# Patient Record
Sex: Female | Born: 2000 | Race: White | Hispanic: No | Marital: Single | Smoking: Current some day smoker
Health system: Southern US, Community
[De-identification: ages and names within clinical notes are randomized; demographics above are authoritative.]

## PROBLEM LIST (undated history)

## (undated) DIAGNOSIS — F32A Depression, unspecified: Secondary | ICD-10-CM

## (undated) DIAGNOSIS — A749 Chlamydial infection, unspecified: Secondary | ICD-10-CM

## (undated) HISTORY — PX: NO PAST SURGERIES: SHX2092

---

## 2019-09-05 NOTE — L&D Delivery Note (Addendum)
Carolyn Macias is a 19 y.o.@ s/p vaginal delivery at [redacted]w[redacted]d.  She was admitted for post dates IOL from MAU where she initially presented for vaginal bleeding.    ROM: 10h 1m with clear white stained fluid GBS Status: negative Maximum Maternal Temperature: 99.8 F   Labor Progress: Pt in latent labor on admission.  She continued to progress well AROM performed for light meconium stained fluid. Cytotec and pitocin was also initiated and pt then was noted to have complete cervical dilation. She then delivered as noted below.   Delivery Date/Time: 08/04/2020 at 1508 Delivery: Called to room and patient was complete and pushing. Head delivered LOA. Single nuchal cord present. Shoulder dystocia identified. McRoberts, followed by attempt at delivery of posterior arm. Successfully delivered newborn with Woodscrew maneuver. Total Dystocia time 70 seconds.   Cord clamped x 2 without delay and cut by physician. Cord blood drawn. Placenta delivered spontaneously with gentle cord traction. Fundus firm with massage and Pitocin. Labia, perineum, vagina, and cervix were inspected; 1st degree perineal and left labial lacerations visualized with repair performed.    Placenta: intact, 3-vessel cord, cord gasses obtained, sent to L&D Complications: none Lacerations: 1st degree perineal and left labial with repair  EBL: 75 ml Analgesia: epidural, local lidocaine    Infant: female  APGARs 4 & 9  weight per medical record  Anupa Ganta, DO  I was present and gloved for delivery and performed above shoulder dystocia maneuvers. I agree with the findings and the plan of care as documented in the resident's note.  Casper Harrison, MD [facility] Family Medicine Fellow, [facility] for [facility], [facility]

## 2020-04-24 ENCOUNTER — Inpatient Hospital Stay (HOSPITAL_COMMUNITY)
Admission: AD | Admit: 2020-04-24 | Discharge: 2020-04-25 | Disposition: A | Discharge status: Home / Self Care | Payer: Medicaid Other | Attending: Obstetrics and Gynecology | Admitting: Obstetrics and Gynecology

## 2020-04-24 ENCOUNTER — Other Ambulatory Visit: Payer: Self-pay

## 2020-04-24 DIAGNOSIS — O2342 Unspecified infection of urinary tract in pregnancy, second trimester: Secondary | ICD-10-CM | POA: Diagnosis not present

## 2020-04-24 DIAGNOSIS — R109 Unspecified abdominal pain: Secondary | ICD-10-CM | POA: Diagnosis present

## 2020-04-24 DIAGNOSIS — M7918 Myalgia, other site: Secondary | ICD-10-CM | POA: Insufficient documentation

## 2020-04-24 DIAGNOSIS — R1032 Left lower quadrant pain: Secondary | ICD-10-CM | POA: Diagnosis not present

## 2020-04-24 DIAGNOSIS — O26892 Other specified pregnancy related conditions, second trimester: Secondary | ICD-10-CM | POA: Diagnosis not present

## 2020-04-24 DIAGNOSIS — Z3A26 26 weeks gestation of pregnancy: Secondary | ICD-10-CM

## 2020-04-24 DIAGNOSIS — O99352 Diseases of the nervous system complicating pregnancy, second trimester: Secondary | ICD-10-CM | POA: Insufficient documentation

## 2020-04-24 DIAGNOSIS — O26899 Other specified pregnancy related conditions, unspecified trimester: Secondary | ICD-10-CM

## 2020-04-24 LAB — URINALYSIS, ROUTINE W REFLEX MICROSCOPIC
Bacteria, UA: NONE SEEN
Bilirubin Urine: NEGATIVE
Glucose, UA: NEGATIVE mg/dL
Hgb urine dipstick: NEGATIVE
Ketones, ur: NEGATIVE mg/dL
Nitrite: NEGATIVE
Protein, ur: NEGATIVE mg/dL
Specific Gravity, Urine: 1.024 (ref 1.005–1.030)
pH: 6 (ref 5.0–8.0)

## 2020-04-24 LAB — POCT PREGNANCY, URINE: Preg Test, Ur: POSITIVE — AB

## 2020-04-25 ENCOUNTER — Inpatient Hospital Stay (HOSPITAL_BASED_OUTPATIENT_CLINIC_OR_DEPARTMENT_OTHER): Payer: Medicaid Other

## 2020-04-25 ENCOUNTER — Encounter (HOSPITAL_COMMUNITY): Payer: Self-pay

## 2020-04-25 ENCOUNTER — Other Ambulatory Visit: Payer: Self-pay

## 2020-04-25 DIAGNOSIS — O2342 Unspecified infection of urinary tract in pregnancy, second trimester: Secondary | ICD-10-CM

## 2020-04-25 DIAGNOSIS — M7918 Myalgia, other site: Secondary | ICD-10-CM

## 2020-04-25 DIAGNOSIS — O26892 Other specified pregnancy related conditions, second trimester: Secondary | ICD-10-CM

## 2020-04-25 DIAGNOSIS — Z3686 Encounter for antenatal screening for cervical length: Secondary | ICD-10-CM

## 2020-04-25 DIAGNOSIS — Z3A26 26 weeks gestation of pregnancy: Secondary | ICD-10-CM

## 2020-04-25 DIAGNOSIS — R109 Unspecified abdominal pain: Secondary | ICD-10-CM

## 2020-04-25 LAB — COMPREHENSIVE METABOLIC PANEL
ALT: 16 U/L (ref 0–44)
AST: 20 U/L (ref 15–41)
Albumin: 3.4 g/dL — ABNORMAL LOW (ref 3.5–5.0)
Alkaline Phosphatase: 63 U/L (ref 38–126)
Anion gap: 9 (ref 5–15)
BUN: 10 mg/dL (ref 6–20)
CO2: 24 mmol/L (ref 22–32)
Calcium: 9.3 mg/dL (ref 8.9–10.3)
Chloride: 101 mmol/L (ref 98–111)
Creatinine, Ser: 0.61 mg/dL (ref 0.44–1.00)
GFR calc Af Amer: >60 mL/min (ref >60)
GFR calc non Af Amer: >60 mL/min (ref >60)
Glucose, Bld: 78 mg/dL (ref 70–99)
Potassium: 4 mmol/L (ref 3.5–5.1)
Sodium: 134 mmol/L — ABNORMAL LOW (ref 135–145)
Total Bilirubin: 0.4 mg/dL (ref 0.3–1.2)
Total Protein: 6.8 g/dL (ref 6.5–8.1)

## 2020-04-25 LAB — HIV ANTIBODY (ROUTINE TESTING W REFLEX): HIV Screen 4th Generation wRfx: NONREACTIVE

## 2020-04-25 LAB — CBC WITH DIFFERENTIAL/PLATELET
Abs Immature Granulocytes: 0.63 10*3/uL — ABNORMAL HIGH (ref 0.00–0.07)
Basophils Absolute: 0.2 10*3/uL — ABNORMAL HIGH (ref 0.0–0.1)
Basophils Relative: 1 %
Eosinophils Absolute: 0.3 10*3/uL (ref 0.0–0.5)
Eosinophils Relative: 2 %
HCT: 35.3 % — ABNORMAL LOW (ref 36.0–46.0)
Hemoglobin: 11.7 g/dL — ABNORMAL LOW (ref 12.0–15.0)
Immature Granulocytes: 4 %
Lymphocytes Relative: 23 %
Lymphs Abs: 3.4 10*3/uL (ref 0.7–4.0)
MCH: 32.4 pg (ref 26.0–34.0)
MCHC: 33.1 g/dL (ref 30.0–36.0)
MCV: 97.8 fL (ref 80.0–100.0)
Monocytes Absolute: 1.4 10*3/uL — ABNORMAL HIGH (ref 0.1–1.0)
Monocytes Relative: 9 %
Neutro Abs: 8.7 10*3/uL — ABNORMAL HIGH (ref 1.7–7.7)
Neutrophils Relative %: 61 %
Platelets: 263 10*3/uL (ref 150–400)
RBC: 3.61 MIL/uL — ABNORMAL LOW (ref 3.87–5.11)
RDW: 12.1 % (ref 11.5–15.5)
WBC: 14.5 10*3/uL — ABNORMAL HIGH (ref 4.0–10.5)
nRBC: 0 % (ref 0.0–0.2)

## 2020-04-25 LAB — ABO/RH: ABO/RH(D): O POS

## 2020-04-25 LAB — WET PREP, GENITAL
Clue Cells Wet Prep HPF POC: NONE SEEN
Sperm: NONE SEEN
Trich, Wet Prep: NONE SEEN
Yeast Wet Prep HPF POC: NONE SEEN

## 2020-04-25 MED ORDER — IBUPROFEN 600 MG PO TABS
600.0000 mg | ORAL_TABLET | Freq: Once | ORAL | Status: AC
  Administered 2020-04-25: 600 mg via ORAL
  Filled 2020-04-25: qty 1

## 2020-04-25 MED ORDER — CEPHALEXIN 500 MG PO CAPS
500.0000 mg | ORAL_CAPSULE | Freq: Four times a day (QID) | ORAL | 0 refills | Status: DC
Start: 2020-04-25 — End: 2020-07-05

## 2020-04-25 MED ORDER — CYCLOBENZAPRINE HCL 5 MG PO TABS
10.0000 mg | ORAL_TABLET | Freq: Once | ORAL | Status: AC
  Administered 2020-04-25: 10 mg via ORAL
  Filled 2020-04-25: qty 2

## 2020-04-25 MED ORDER — CYCLOBENZAPRINE HCL 10 MG PO TABS: 10.0000 mg | ORAL_TABLET | Freq: Three times a day (TID) | ORAL | 0 refills | Status: DC | PRN

## 2020-04-25 NOTE — Discharge Instructions (Signed)
[facility] Prenatal Care Providers °         °[facility] locations:  °Hours may vary. Please call for an appointment ° °[facility] @ [place] ° [address]  °[phone] ° °[facility] @ [facility]  ° [address]  °[phone] ° °[facility] @ [place]      ° [address] °[phone]   °         °[facility] @ [place]    ° [address] °[phone] °         °[facility] @ [place]  ° [address] °[phone] ° °[facility] @ [facility] ° [address] °[phone] °    °[facility] @ [facility]) ° [address]  ° [phone] ° °

## 2020-04-25 NOTE — MAU Provider Note (Signed)
History     CSN: [id number]  Arrival date and time: 04/24/20 2154   First Provider Initiated Contact with Patient 04/25/20 0057      Chief Complaint  Patient presents with  . Abdominal Pain   HPI  Ms.Carolyn Macias is a 19 y.o. female G1P0 @ [redacted]w[redacted]d here with LLQ pain that radiates around to her left hip. The pain woke her up from her sleep last night. She tried tylenol which did not help. She moved from [place] 5 days ago and has been carrying and moving things. No bleeding.  The pain worsens when she walks.  The pain is better when she is sitting up. She was receiving regular prenatal care in [place]. Reports pain/pressure after urinating.   OB History    Gravida  1   Para      Term      Preterm      AB      Living        SAB      TAB      Ectopic      Multiple      Live Births              History reviewed. No pertinent past medical history.  Past Surgical History:  Procedure Laterality Date  . NO PAST SURGERIES      No family history on file.  Social History   Tobacco Use  . Smoking status: Never Smoker  . Smokeless tobacco: Never Used  Substance Use Topics  . Alcohol use: Never  . Drug use: Never    Allergies: No Known Allergies  Medications Prior to Admission  Medication Sig Dispense Refill Last Dose  . acetaminophen (TYLENOL) 500 MG tablet Take 500 mg by mouth every 6 (six) hours as needed.   04/25/2020 at 9pm  . Prenatal Vit-Fe Fumarate-FA (MULTIVITAMIN-PRENATAL) 27-0.8 MG TABS tablet Take 1 tablet by mouth daily at 12 noon.   04/25/2020 at Unknown time   Results for orders placed or performed during the hospital encounter of 04/24/20 (from the past 48 hour(s))  Pregnancy, urine POC     Status: Abnormal   Collection Time: 04/24/20 10:32 PM  Result Value Ref Range   Preg Test, Ur POSITIVE (A) NEGATIVE    Comment:        THE SENSITIVITY OF THIS METHODOLOGY IS >24 mIU/mL   Urinalysis, Routine w reflex microscopic Urine,  Clean Catch     Status: Abnormal   Collection Time: 04/24/20 11:20 PM  Result Value Ref Range   Color, Urine YELLOW YELLOW   APPearance CLOUDY (A) CLEAR   Specific Gravity, Urine 1.024 1.005 - 1.030   pH 6.0 5.0 - 8.0   Glucose, UA NEGATIVE NEGATIVE mg/dL   Hgb urine dipstick NEGATIVE NEGATIVE   Bilirubin Urine NEGATIVE NEGATIVE   Ketones, ur NEGATIVE NEGATIVE mg/dL   Protein, ur NEGATIVE NEGATIVE mg/dL   Nitrite NEGATIVE NEGATIVE   Leukocytes,Ua SMALL (A) NEGATIVE   RBC / HPF 0-5 0 - 5 RBC/hpf   WBC, UA 6-10 0 - 5 WBC/hpf   Bacteria, UA NONE SEEN NONE SEEN   Squamous Epithelial / LPF 0-5 0 - 5   Mucus PRESENT    Amorphous Crystal PRESENT     Comment: Performed at [facility], [address], [place], [place] [address]  CBC with Differential/Platelet     Status: Abnormal   Collection Time: 04/25/20 12:19 AM  Result Value Ref Range   WBC 14.5 (  H) 4.0 - 10.5 K/uL   RBC 3.61 (L) 3.87 - 5.11 MIL/uL   Hemoglobin 11.7 (L) 12.0 - 15.0 g/dL   HCT 79.8 (L) 36 - 46 %   MCV 97.8 80.0 - 100.0 fL   MCH 32.4 26.0 - 34.0 pg   MCHC 33.1 30.0 - 36.0 g/dL   RDW 92.1 19.4 - 17.4 %   Platelets 263 150 - 400 K/uL   nRBC 0.0 0.0 - 0.2 %   Neutrophils Relative % 61 %   Neutro Abs 8.7 (H) 1.7 - 7.7 K/uL   Lymphocytes Relative 23 %   Lymphs Abs 3.4 0.7 - 4.0 K/uL   Monocytes Relative 9 %   Monocytes Absolute 1.4 (H) 0 - 1 K/uL   Eosinophils Relative 2 %   Eosinophils Absolute 0.3 0 - 0 K/uL   Basophils Relative 1 %   Basophils Absolute 0.2 (H) 0 - 0 K/uL   Immature Granulocytes 4 %   Abs Immature Granulocytes 0.63 (H) 0.00 - 0.07 K/uL    Comment: Performed at [facility], [address], [place], [place] [address]  ABO/Rh     Status: None   Collection Time: 04/25/20 12:19 AM  Result Value Ref Range   ABO/RH(D) O POS    No rh immune globuloin      NOT A RH IMMUNE GLOBULIN CANDIDATE, PT RH POSITIVE Performed at [facility], [address], [place], [place] [address]        Review of Systems  Constitutional: Negative for fever.  Genitourinary: Positive for urgency. Negative for dysuria and flank pain.  Psychiatric/Behavioral: Hallucinations:    Physical Exam   Blood pressure 116/70, pulse 73, temperature 97.8 F (36.6 C), temperature source Oral, resp. rate 16, last menstrual period 10/22/2019, SpO2 100 %.  Physical Exam Constitutional:      General: She is not in acute distress.    Appearance: Normal appearance. She is not ill-appearing.  HENT:     Head: Normocephalic.  Eyes:     Pupils: Pupils are equal, round, and reactive to light.  Abdominal:     Palpations: Abdomen is soft.     Tenderness: There is abdominal tenderness in the left lower quadrant. There is no right CVA tenderness, left CVA tenderness, guarding or rebound.  Genitourinary:    Comments: Cervix: Closed, thick, posterior.  Musculoskeletal:       Arms:     Lumbar back: Tenderness present. Normal range of motion.  Skin:    General: Skin is warm.  Neurological:     General: No focal deficit present.     Mental Status: She is alert.  Psychiatric:        Mood and Affect: Mood normal.    Fetal Tracing: Baseline: 130 bpm Variability: Moderate  Accelerations:  15x15 Decelerations: None Toco: UI  MAU Course  Procedures  None   MDM  Symptoms likely secondary to musculoskeletal and urinary.  Patient given flexeril and ibuprofen with improvement in pain.  Urine culture pending   Assessment and Plan   A:  1. Musculoskeletal pain   2. Abdominal pain affecting pregnancy   3. [redacted] weeks gestation of pregnancy   4. Urinary tract infection in mother during second trimester of pregnancy     P:  Discharge home in stable condition List of providers given- encouraged to call to initiate care Rx: Keflex, flexeril No lifting over 5 lbs Rest Return to MAU if symptoms worsen   Toby Breithaupt, Victorino Dike  I, NP 04/26/2020 3:59 PM

## 2020-04-25 NOTE — MAU Note (Signed)
Carolyn Macias is a 19 y.o. at Unknown here in MAU reporting: left sided pain and tightness since lastnight pt also state she feel nauseous.   Onset of complaint: lastnight Pain score: 6 Vitals:   04/25/20 0003  BP: 116/70  Pulse: 73  Resp: 16  Temp: 97.8 F (36.6 C)  SpO2: 100%

## 2020-04-26 LAB — CULTURE, OB URINE: Culture: <10000 — AB

## 2020-04-26 LAB — GC/CHLAMYDIA PROBE AMP (~~LOC~~) NOT AT ARMC
Chlamydia: NEGATIVE
Comment: NEGATIVE
Comment: NORMAL
Neisseria Gonorrhea: NEGATIVE

## 2020-07-05 ENCOUNTER — Inpatient Hospital Stay (HOSPITAL_COMMUNITY)
Admission: AD | Admit: 2020-07-05 | Discharge: 2020-07-05 | Disposition: A | Discharge status: Home / Self Care | Payer: Medicaid Other | Attending: Family Medicine | Admitting: Family Medicine

## 2020-07-05 ENCOUNTER — Encounter (HOSPITAL_COMMUNITY): Payer: Self-pay | Admitting: Family Medicine

## 2020-07-05 DIAGNOSIS — R55 Syncope and collapse: Secondary | ICD-10-CM | POA: Insufficient documentation

## 2020-07-05 DIAGNOSIS — O99891 Other specified diseases and conditions complicating pregnancy: Secondary | ICD-10-CM | POA: Diagnosis not present

## 2020-07-05 DIAGNOSIS — Z3A36 36 weeks gestation of pregnancy: Secondary | ICD-10-CM | POA: Diagnosis not present

## 2020-07-05 DIAGNOSIS — R42 Dizziness and giddiness: Secondary | ICD-10-CM | POA: Diagnosis not present

## 2020-07-05 DIAGNOSIS — Z87891 Personal history of nicotine dependence: Secondary | ICD-10-CM | POA: Diagnosis not present

## 2020-07-05 DIAGNOSIS — O26893 Other specified pregnancy related conditions, third trimester: Secondary | ICD-10-CM | POA: Diagnosis present

## 2020-07-05 HISTORY — DX: Chlamydial infection, unspecified: A74.9

## 2020-07-05 LAB — CBC WITH DIFFERENTIAL/PLATELET
Abs Immature Granulocytes: 0.72 10*3/uL — ABNORMAL HIGH (ref 0.00–0.07)
Basophils Absolute: 0.1 10*3/uL (ref 0.0–0.1)
Basophils Relative: 1 %
Eosinophils Absolute: 0.2 10*3/uL (ref 0.0–0.5)
Eosinophils Relative: 1 %
HCT: 32.2 % — ABNORMAL LOW (ref 36.0–46.0)
Hemoglobin: 10.5 g/dL — ABNORMAL LOW (ref 12.0–15.0)
Immature Granulocytes: 5 %
Lymphocytes Relative: 13 %
Lymphs Abs: 1.9 10*3/uL (ref 0.7–4.0)
MCH: 30.9 pg (ref 26.0–34.0)
MCHC: 32.6 g/dL (ref 30.0–36.0)
MCV: 94.7 fL (ref 80.0–100.0)
Monocytes Absolute: 1.1 10*3/uL — ABNORMAL HIGH (ref 0.1–1.0)
Monocytes Relative: 8 %
Neutro Abs: 10.3 10*3/uL — ABNORMAL HIGH (ref 1.7–7.7)
Neutrophils Relative %: 72 %
Platelets: 230 10*3/uL (ref 150–400)
RBC: 3.4 MIL/uL — ABNORMAL LOW (ref 3.87–5.11)
RDW: 13.4 % (ref 11.5–15.5)
WBC: 14.4 10*3/uL — ABNORMAL HIGH (ref 4.0–10.5)
nRBC: 0 % (ref 0.0–0.2)

## 2020-07-05 LAB — TYPE AND SCREEN
ABO/RH(D): O POS
Antibody Screen: NEGATIVE

## 2020-07-05 LAB — URINALYSIS, ROUTINE W REFLEX MICROSCOPIC
Bilirubin Urine: NEGATIVE
Glucose, UA: NEGATIVE mg/dL
Hgb urine dipstick: NEGATIVE
Ketones, ur: NEGATIVE mg/dL
Leukocytes,Ua: NEGATIVE
Nitrite: NEGATIVE
Protein, ur: NEGATIVE mg/dL
Specific Gravity, Urine: 1.015 (ref 1.005–1.030)
pH: 7 (ref 5.0–8.0)

## 2020-07-05 LAB — COMPREHENSIVE METABOLIC PANEL
ALT: 11 U/L (ref 0–44)
AST: 16 U/L (ref 15–41)
Albumin: 2.7 g/dL — ABNORMAL LOW (ref 3.5–5.0)
Alkaline Phosphatase: 87 U/L (ref 38–126)
Anion gap: 8 (ref 5–15)
BUN: 5 mg/dL — ABNORMAL LOW (ref 6–20)
CO2: 24 mmol/L (ref 22–32)
Calcium: 9.2 mg/dL (ref 8.9–10.3)
Chloride: 103 mmol/L (ref 98–111)
Creatinine, Ser: 0.61 mg/dL (ref 0.44–1.00)
GFR, Estimated: >60 mL/min (ref >60)
Glucose, Bld: 91 mg/dL (ref 70–99)
Potassium: 3.8 mmol/L (ref 3.5–5.1)
Sodium: 135 mmol/L (ref 135–145)
Total Bilirubin: 0.6 mg/dL (ref 0.3–1.2)
Total Protein: 6.2 g/dL — ABNORMAL LOW (ref 6.5–8.1)

## 2020-07-05 LAB — MAGNESIUM: Magnesium: 1.6 mg/dL — ABNORMAL LOW (ref 1.7–2.4)

## 2020-07-05 LAB — HIV ANTIBODY (ROUTINE TESTING W REFLEX): HIV Screen 4th Generation wRfx: NONREACTIVE

## 2020-07-05 LAB — HEPATITIS B SURFACE ANTIGEN: Hepatitis B Surface Ag: NONREACTIVE

## 2020-07-05 NOTE — Discharge Instructions (Signed)

## 2020-07-05 NOTE — MAU Provider Note (Signed)
History     CSN: [id number]  Arrival date and time: 07/05/20 1426   First Provider Initiated Contact with Patient 07/05/20 1507      Chief Complaint  Patient presents with  . Near Syncope   HPI This is a 19 yo G1 at [redacted]w[redacted]d by stated EDC. Patient had care in [place] until about [redacted] weeks gestation. She and her partner moved to [place] around that time and haven't had care since that time. Presents with dizziness earlier today and wanted to get checked out. Dizziness improved. Adequate fluid intake.   OB History    Gravida  1   Para      Term      Preterm      AB      Living        SAB      TAB      Ectopic      Multiple      Live Births              Past Medical History:  Diagnosis Date  . Chlamydia     Past Surgical History:  Procedure Laterality Date  . NO PAST SURGERIES      No family history on file.  Social History   Tobacco Use  . Smoking status: Former Games developer  . Smokeless tobacco: Never Used  Substance Use Topics  . Alcohol use: Never  . Drug use: Never    Allergies: No Known Allergies  Medications Prior to Admission  Medication Sig Dispense Refill Last Dose  . calcium carbonate (TUMS - DOSED IN MG ELEMENTAL CALCIUM) 500 MG chewable tablet Chew 1 tablet by mouth daily.     . Prenatal Vit-Fe Fumarate-FA (MULTIVITAMIN-PRENATAL) 27-0.8 MG TABS tablet Take 1 tablet by mouth daily at 12 noon.   07/05/2020 at Unknown time  . acetaminophen (TYLENOL) 500 MG tablet Take 500 mg by mouth every 6 (six) hours as needed.     . cephALEXin (KEFLEX) 500 MG capsule Take 1 capsule (500 mg total) by mouth 4 (four) times daily. 28 capsule 0   . cyclobenzaprine (FLEXERIL) 10 MG tablet Take 1 tablet (10 mg total) by mouth 3 (three) times daily as needed for muscle spasms. 20 tablet 0 More than a month at Unknown time    Review of Systems Physical Exam   Blood pressure 122/69, pulse 99, temperature 98.7 F (37.1 C), resp. rate 18, last menstrual period  10/22/2019.  Physical Exam Vitals reviewed.  Constitutional:      Appearance: Normal appearance.  Cardiovascular:     Rate and Rhythm: Normal rate and regular rhythm.     Pulses: Normal pulses.     Heart sounds: Normal heart sounds.  Pulmonary:     Effort: Pulmonary effort is normal.     Breath sounds: Normal breath sounds.  Abdominal:     General: Abdomen is flat.     Palpations: Abdomen is soft.  Skin:    General: Skin is warm.     Capillary Refill: Capillary refill takes less than 2 seconds.  Neurological:     Mental Status: She is alert.  Psychiatric:        Mood and Affect: Mood normal.        Behavior: Behavior normal.        Thought Content: Thought content normal.        Judgment: Judgment normal.    Results for orders placed or performed during the hospital encounter of 07/05/20 (from the  past 24 hour(s))  Urinalysis, Routine w reflex microscopic Urine, Clean Catch     Status: Abnormal   Collection Time: 07/05/20  3:08 PM  Result Value Ref Range   Color, Urine YELLOW YELLOW   APPearance CLOUDY (A) CLEAR   Specific Gravity, Urine 1.015 1.005 - 1.030   pH 7.0 5.0 - 8.0   Glucose, UA NEGATIVE NEGATIVE mg/dL   Hgb urine dipstick NEGATIVE NEGATIVE   Bilirubin Urine NEGATIVE NEGATIVE   Ketones, ur NEGATIVE NEGATIVE mg/dL   Protein, ur NEGATIVE NEGATIVE mg/dL   Nitrite NEGATIVE NEGATIVE   Leukocytes,Ua NEGATIVE NEGATIVE  Comprehensive metabolic panel     Status: Abnormal   Collection Time: 07/05/20  3:21 PM  Result Value Ref Range   Sodium 135 135 - 145 mmol/L   Potassium 3.8 3.5 - 5.1 mmol/L   Chloride 103 98 - 111 mmol/L   CO2 24 22 - 32 mmol/L   Glucose, Bld 91 70 - 99 mg/dL   BUN 5 (L) 6 - 20 mg/dL   Creatinine, Ser 7.74 0.44 - 1.00 mg/dL   Calcium 9.2 8.9 - 12.8 mg/dL   Total Protein 6.2 (L) 6.5 - 8.1 g/dL   Albumin 2.7 (L) 3.5 - 5.0 g/dL   AST 16 15 - 41 U/L   ALT 11 0 - 44 U/L   Alkaline Phosphatase 87 38 - 126 U/L   Total Bilirubin 0.6 0.3 - 1.2  mg/dL   GFR, Estimated >78 >67 mL/min   Anion gap 8 5 - 15  Magnesium     Status: Abnormal   Collection Time: 07/05/20  3:21 PM  Result Value Ref Range   Magnesium 1.6 (L) 1.7 - 2.4 mg/dL  Type and screen [facility]     Status: None (Preliminary result)   Collection Time: 07/05/20  3:21 PM  Result Value Ref Range   ABO/RH(D) PENDING    Antibody Screen PENDING    Sample Expiration      07/08/2020,2359 Performed at [facility], [address], [place], [place] [address]   CBC with Differential/Platelet     Status: Abnormal   Collection Time: 07/05/20  3:21 PM  Result Value Ref Range   WBC 14.4 (H) 4.0 - 10.5 K/uL   RBC 3.40 (L) 3.87 - 5.11 MIL/uL   Hemoglobin 10.5 (L) 12.0 - 15.0 g/dL   HCT 47.0 (L) 36 - 46 %   MCV 94.7 80.0 - 100.0 fL   MCH 30.9 26.0 - 34.0 pg   MCHC 32.6 30.0 - 36.0 g/dL   RDW 96.2 83.6 - 62.9 %   Platelets 230 150 - 400 K/uL   nRBC 0.0 0.0 - 0.2 %   Neutrophils Relative % 72 %   Neutro Abs 10.3 (H) 1.7 - 7.7 K/uL   Lymphocytes Relative 13 %   Lymphs Abs 1.9 0.7 - 4.0 K/uL   Monocytes Relative 8 %   Monocytes Absolute 1.1 (H) 0.1 - 1.0 K/uL   Eosinophils Relative 1 %   Eosinophils Absolute 0.2 0.0 - 0.5 K/uL   Basophils Relative 1 %   Basophils Absolute 0.1 0.0 - 0.1 K/uL   Immature Granulocytes 5 %   Abs Immature Granulocytes 0.72 (H) 0.00 - 0.07 K/uL     MAU Course  Procedures EKG reviewed by me: Normal sinus rhythm. No acute ST changes. Inverted p wave in V1 - possible L atrial enlargement.  NST 140s, moderate variability. +accels. No decels or contractions.  MDM  Assessment and Plan  1. [redacted] weeks gestation of pregnancy  2. Postural dizziness with presyncope  PNL obtained.  CMP and CBC reassuring Cat 1 tracing Establish care in office    Levie Heritage 07/05/2020, 3:20 PM

## 2020-07-05 NOTE — MAU Note (Signed)
Pt moved here from [place] a few weeks ago. Needs to establish prenatal care.  Called and was told [facility] would be able to take her. Concerned because it had been a while since she has had a prenatal visit. Reports  She was feeling dizzy overhthere weekend while they where moving in. (did not due much lifting or moving of stuff). Reports regular fetal movement felt. Denies any pain or cramping no leaking or bleeding.

## 2020-07-06 ENCOUNTER — Encounter: Payer: Self-pay | Admitting: Family Medicine

## 2020-07-06 DIAGNOSIS — O99891 Other specified diseases and conditions complicating pregnancy: Secondary | ICD-10-CM | POA: Insufficient documentation

## 2020-07-06 DIAGNOSIS — O09899 Supervision of other high risk pregnancies, unspecified trimester: Secondary | ICD-10-CM | POA: Insufficient documentation

## 2020-07-06 LAB — RUBELLA SCREEN: Rubella: <0.9 index — ABNORMAL LOW (ref >0.99)

## 2020-07-06 LAB — RPR: RPR Ser Ql: NONREACTIVE

## 2020-07-13 ENCOUNTER — Telehealth: Payer: Self-pay | Admitting: Family Medicine

## 2020-07-13 NOTE — Telephone Encounter (Signed)
Spoke with patient about her visit in the MAU, and how the provider wanted her to be set up for an appointment in our office. She stated she was 38 weeks, and was no need to get an appointment scheduled. I then informed her that because she has [employer] Medicaid, she needed to call her worker to get it switched over to one that is not out-of-network. I informed her she would get a large bill from the hospital. She said she would call them.

## 2020-07-28 ENCOUNTER — Inpatient Hospital Stay (HOSPITAL_COMMUNITY): Admit: 2020-07-28 | Payer: Self-pay

## 2020-07-31 ENCOUNTER — Other Ambulatory Visit: Payer: Self-pay | Admitting: Obstetrics & Gynecology

## 2020-07-31 ENCOUNTER — Inpatient Hospital Stay (HOSPITAL_BASED_OUTPATIENT_CLINIC_OR_DEPARTMENT_OTHER): Payer: Medicaid Other

## 2020-07-31 ENCOUNTER — Inpatient Hospital Stay (HOSPITAL_COMMUNITY)
Admission: AD | Admit: 2020-07-31 | Discharge: 2020-07-31 | Disposition: A | Discharge status: Home / Self Care | Payer: Medicaid Other | Attending: Obstetrics & Gynecology | Admitting: Obstetrics & Gynecology

## 2020-07-31 ENCOUNTER — Other Ambulatory Visit: Payer: Self-pay

## 2020-07-31 ENCOUNTER — Encounter (HOSPITAL_COMMUNITY): Payer: Self-pay | Admitting: Obstetrics & Gynecology

## 2020-07-31 ENCOUNTER — Other Ambulatory Visit: Payer: Self-pay | Admitting: Advanced Practice Midwife

## 2020-07-31 DIAGNOSIS — Z3A40 40 weeks gestation of pregnancy: Secondary | ICD-10-CM

## 2020-07-31 DIAGNOSIS — O26893 Other specified pregnancy related conditions, third trimester: Secondary | ICD-10-CM

## 2020-07-31 DIAGNOSIS — Z87891 Personal history of nicotine dependence: Secondary | ICD-10-CM | POA: Diagnosis not present

## 2020-07-31 DIAGNOSIS — Z0379 Encounter for other suspected maternal and fetal conditions ruled out: Secondary | ICD-10-CM | POA: Diagnosis not present

## 2020-07-31 DIAGNOSIS — O480 Post-term pregnancy: Secondary | ICD-10-CM | POA: Diagnosis present

## 2020-07-31 DIAGNOSIS — R102 Pelvic and perineal pain: Secondary | ICD-10-CM

## 2020-07-31 DIAGNOSIS — O0933 Supervision of pregnancy with insufficient antenatal care, third trimester: Secondary | ICD-10-CM

## 2020-07-31 DIAGNOSIS — R519 Headache, unspecified: Secondary | ICD-10-CM | POA: Diagnosis not present

## 2020-07-31 DIAGNOSIS — O479 False labor, unspecified: Secondary | ICD-10-CM

## 2020-07-31 DIAGNOSIS — Z3689 Encounter for other specified antenatal screening: Secondary | ICD-10-CM

## 2020-07-31 LAB — OB RESULTS CONSOLE GBS: GBS: NEGATIVE

## 2020-07-31 NOTE — Discharge Instructions (Signed)
Return on Wednesday at 11:45 pm for induction.

## 2020-07-31 NOTE — MAU Note (Signed)
Really bad HA last night, continued until this afternoon. Thursday had some ? ctxs or Deberah Pelton.  Just wanted to be sure everything was ok, she is 3 days past her due date.  No pain, bleeding  Or leaking at this time.  Reports +FM

## 2020-07-31 NOTE — MAU Provider Note (Signed)
History     CSN: [id number]  Arrival date and time: 07/31/20 1723   First Provider Initiated Contact with Patient 07/31/20 1759      Chief Complaint  Patient presents with  . wanting to make sure everything is ok   HPI Carolyn Macias 19 y.o. [redacted]w[redacted]d Comes to MAU today to make sure that the baby is OK.  She had a severe headache yesterday and this morning but it is now resolved.  She was worried that her BP was high but it is normal in MAU. Client moved from [place] at 26 weeks and has been seen in MAU twice.  She does not see a provider in [place] for prenatal care.  Did apply for Medicaid in [place] but was assigned to [facility] and that is not an insurance accepted in [facility] offices.   OB History    Gravida  1   Para      Term      Preterm      AB      Living        SAB      TAB      Ectopic      Multiple      Live Births              Past Medical History:  Diagnosis Date  . Chlamydia     Past Surgical History:  Procedure Laterality Date  . NO PAST SURGERIES      History reviewed. No pertinent family history.  Social History   Tobacco Use  . Smoking status: Former Games developer  . Smokeless tobacco: Never Used  Substance Use Topics  . Alcohol use: Never  . Drug use: Never    Allergies: No Known Allergies  Medications Prior to Admission  Medication Sig Dispense Refill Last Dose  . acetaminophen (TYLENOL) 500 MG tablet Take 500 mg by mouth every 6 (six) hours as needed.   07/30/2020 at Unknown time  . calcium carbonate (TUMS - DOSED IN MG ELEMENTAL CALCIUM) 500 MG chewable tablet Chew 1 tablet by mouth daily.   07/30/2020 at Unknown time  . cyclobenzaprine (FLEXERIL) 10 MG tablet Take 1 tablet (10 mg total) by mouth 3 (three) times daily as needed for muscle spasms. 20 tablet 0 Past Month at Unknown time  . Prenatal Vit-Fe Fumarate-FA (MULTIVITAMIN-PRENATAL) 27-0.8 MG TABS tablet Take 1 tablet by mouth daily at 12 noon.   07/31/2020 at Unknown  time    Review of Systems  Constitutional: Negative for fever.  Respiratory: Negative for cough and shortness of breath.   Gastrointestinal: Negative for abdominal pain, nausea and vomiting.  Genitourinary: Negative for dysuria, vaginal bleeding and vaginal discharge.   Physical Exam   Blood pressure 134/72, pulse (!) 117, temperature 98.2 F (36.8 C), temperature source Oral, resp. rate 16, height 5\' 5"  (1.651 m), weight 90.5 kg, last menstrual period 10/22/2019, SpO2 100 %.  Physical Exam Vitals and nursing note reviewed.  Constitutional:      Appearance: She is well-developed.  HENT:     Head: Normocephalic.  Abdominal:     Palpations: Abdomen is soft.     Tenderness: There is no abdominal tenderness. There is no guarding or rebound.  Genitourinary:    Comments: GBS and GC/Chlam done by vaginal swabs Cervical exam - Vertex at -2 with very posterior cervex that is closed but soft. Musculoskeletal:        General: Normal range of motion.     Cervical  back: Neck supple.  Skin:    General: Skin is warm and dry.  Neurological:     Mental Status: She is alert and oriented to person, place, and time.     MAU Course  Procedures  MDM Consult with Dr. Macon Large - will schedule for midnight induction on Wednesday night. BPP today is 8/8 with good amount of amniotic fluid, BP is in normal range. No headache. NST - FHT baseline is 140 with accels of >15X15 noted, No decelerations, Occasional contraction - Category 1 tracing.  Assessment and Plan  Post term pregnancy, stable Reactive NST  Plan GBS pending Midnight induction scheduled - to come in on Wednesday, 08-04-20 at 11:45 pm for induction. Return sooner if water breaks, baby is not moving well or having regular labor contractions.  Carolyn Bernheim, RN, MSN, NP-BC Nurse Practitioner, [facility] for [employer], [facility] 07/31/2020 8:48 PM    Carolyn Macias Carolyn Macias 07/31/2020, 6:08 PM

## 2020-08-02 ENCOUNTER — Telehealth (HOSPITAL_COMMUNITY): Payer: Self-pay | Admitting: *Deleted

## 2020-08-02 LAB — GC/CHLAMYDIA PROBE AMP (~~LOC~~) NOT AT ARMC
Chlamydia: NEGATIVE
Comment: NEGATIVE
Comment: NORMAL
Neisseria Gonorrhea: NEGATIVE

## 2020-08-02 LAB — CULTURE, BETA STREP (GROUP B ONLY)

## 2020-08-02 NOTE — Telephone Encounter (Signed)
Preadmission screen  

## 2020-08-03 ENCOUNTER — Inpatient Hospital Stay (HOSPITAL_COMMUNITY): Payer: Medicaid Other | Admitting: Anesthesiology

## 2020-08-03 ENCOUNTER — Encounter (HOSPITAL_COMMUNITY): Payer: Self-pay | Admitting: Obstetrics & Gynecology

## 2020-08-03 ENCOUNTER — Other Ambulatory Visit (HOSPITAL_COMMUNITY): Payer: Medicaid Other | Attending: Family Medicine

## 2020-08-03 ENCOUNTER — Inpatient Hospital Stay (HOSPITAL_COMMUNITY)
Admission: AD | Admit: 2020-08-03 | Discharge: 2020-08-06 | DRG: 807 | Disposition: A | Discharge status: Home / Self Care | Payer: Medicaid Other | Attending: Obstetrics and Gynecology | Admitting: Obstetrics and Gynecology

## 2020-08-03 ENCOUNTER — Other Ambulatory Visit: Payer: Self-pay

## 2020-08-03 ENCOUNTER — Telehealth (HOSPITAL_COMMUNITY): Payer: Self-pay | Admitting: *Deleted

## 2020-08-03 DIAGNOSIS — O480 Post-term pregnancy: Secondary | ICD-10-CM | POA: Diagnosis present

## 2020-08-03 DIAGNOSIS — Z20822 Contact with and (suspected) exposure to covid-19: Secondary | ICD-10-CM | POA: Diagnosis present

## 2020-08-03 DIAGNOSIS — O99214 Obesity complicating childbirth: Secondary | ICD-10-CM | POA: Diagnosis present

## 2020-08-03 DIAGNOSIS — E669 Obesity, unspecified: Secondary | ICD-10-CM | POA: Diagnosis present

## 2020-08-03 DIAGNOSIS — Z3A41 41 weeks gestation of pregnancy: Secondary | ICD-10-CM | POA: Diagnosis not present

## 2020-08-03 DIAGNOSIS — Z3A40 40 weeks gestation of pregnancy: Secondary | ICD-10-CM | POA: Diagnosis not present

## 2020-08-03 DIAGNOSIS — O0933 Supervision of pregnancy with insufficient antenatal care, third trimester: Secondary | ICD-10-CM

## 2020-08-03 DIAGNOSIS — Z23 Encounter for immunization: Secondary | ICD-10-CM

## 2020-08-03 DIAGNOSIS — Z8759 Personal history of other complications of pregnancy, childbirth and the puerperium: Secondary | ICD-10-CM | POA: Diagnosis not present

## 2020-08-03 DIAGNOSIS — Z87891 Personal history of nicotine dependence: Secondary | ICD-10-CM | POA: Diagnosis not present

## 2020-08-03 LAB — COMPREHENSIVE METABOLIC PANEL
ALT: 15 U/L (ref 0–44)
AST: 19 U/L (ref 15–41)
Albumin: 3 g/dL — ABNORMAL LOW (ref 3.5–5.0)
Alkaline Phosphatase: 111 U/L (ref 38–126)
Anion gap: 11 (ref 5–15)
BUN: 9 mg/dL (ref 6–20)
CO2: 20 mmol/L — ABNORMAL LOW (ref 22–32)
Calcium: 9.2 mg/dL (ref 8.9–10.3)
Chloride: 104 mmol/L (ref 98–111)
Creatinine, Ser: 0.66 mg/dL (ref 0.44–1.00)
GFR, Estimated: >60 mL/min (ref >60)
Glucose, Bld: 67 mg/dL — ABNORMAL LOW (ref 70–99)
Potassium: 3.6 mmol/L (ref 3.5–5.1)
Sodium: 135 mmol/L (ref 135–145)
Total Bilirubin: 0.6 mg/dL (ref 0.3–1.2)
Total Protein: 6.7 g/dL (ref 6.5–8.1)

## 2020-08-03 LAB — CBC
HCT: 36.1 % (ref 36.0–46.0)
Hemoglobin: 11.5 g/dL — ABNORMAL LOW (ref 12.0–15.0)
MCH: 29.7 pg (ref 26.0–34.0)
MCHC: 31.9 g/dL (ref 30.0–36.0)
MCV: 93.3 fL (ref 80.0–100.0)
Platelets: 228 10*3/uL (ref 150–400)
RBC: 3.87 MIL/uL (ref 3.87–5.11)
RDW: 13.8 % (ref 11.5–15.5)
WBC: 13.5 10*3/uL — ABNORMAL HIGH (ref 4.0–10.5)
nRBC: 0 % (ref 0.0–0.2)

## 2020-08-03 LAB — RESP PANEL BY RT-PCR (FLU A&B, COVID) ARPGX2
Influenza A by PCR: NEGATIVE
Influenza B by PCR: NEGATIVE
SARS Coronavirus 2 by RT PCR: NEGATIVE

## 2020-08-03 LAB — TYPE AND SCREEN
ABO/RH(D): O POS
Antibody Screen: NEGATIVE

## 2020-08-03 MED ORDER — PHENYLEPHRINE 40 MCG/ML (10ML) SYRINGE FOR IV PUSH (FOR BLOOD PRESSURE SUPPORT): 80.0000 ug | PREFILLED_SYRINGE | INTRAVENOUS | Status: DC | PRN

## 2020-08-03 MED ORDER — FENTANYL-BUPIVACAINE-NACL 0.5-0.125-0.9 MG/250ML-% EP SOLN
EPIDURAL | Status: AC
  Filled 2020-08-03: qty 250

## 2020-08-03 MED ORDER — EPHEDRINE 5 MG/ML INJ: 10.0000 mg | INTRAVENOUS | Status: DC | PRN

## 2020-08-03 MED ORDER — FAMOTIDINE IN NACL 20-0.9 MG/50ML-% IV SOLN
20.0000 mg | Freq: Once | INTRAVENOUS | Status: AC
  Administered 2020-08-03: 20 mg via INTRAVENOUS
  Filled 2020-08-03: qty 50

## 2020-08-03 MED ORDER — FENTANYL-BUPIVACAINE-NACL 0.5-0.125-0.9 MG/250ML-% EP SOLN: 12.0000 mL/h | EPIDURAL | Status: DC | PRN

## 2020-08-03 MED ORDER — ONDANSETRON HCL 4 MG/2ML IJ SOLN: 4.0000 mg | Freq: Four times a day (QID) | INTRAMUSCULAR | Status: DC | PRN

## 2020-08-03 MED ORDER — SODIUM CHLORIDE (PF) 0.9 % IJ SOLN
INTRAMUSCULAR | Status: DC | PRN
  Administered 2020-08-03: 12 mL/h via EPIDURAL

## 2020-08-03 MED ORDER — TERBUTALINE SULFATE 1 MG/ML IJ SOLN: 0.2500 mg | Freq: Once | INTRAMUSCULAR | Status: DC | PRN

## 2020-08-03 MED ORDER — MISOPROSTOL 25 MCG QUARTER TABLET: 25.0000 ug | ORAL_TABLET | ORAL | Status: DC | PRN

## 2020-08-03 MED ORDER — ZOLPIDEM TARTRATE 5 MG PO TABS: 5.0000 mg | ORAL_TABLET | Freq: Every evening | ORAL | Status: DC | PRN

## 2020-08-03 MED ORDER — SOD CITRATE-CITRIC ACID 500-334 MG/5ML PO SOLN: 30.0000 mL | ORAL | Status: DC | PRN

## 2020-08-03 MED ORDER — OXYCODONE-ACETAMINOPHEN 5-325 MG PO TABS: 1.0000 | ORAL_TABLET | ORAL | Status: DC | PRN

## 2020-08-03 MED ORDER — OXYTOCIN-SODIUM CHLORIDE 30-0.9 UT/500ML-% IV SOLN
2.5000 [IU]/h | INTRAVENOUS | Status: DC
  Filled 2020-08-03: qty 500

## 2020-08-03 MED ORDER — DIPHENHYDRAMINE HCL 50 MG/ML IJ SOLN: 12.5000 mg | INTRAMUSCULAR | Status: DC | PRN

## 2020-08-03 MED ORDER — MISOPROSTOL 50MCG HALF TABLET
50.0000 ug | ORAL_TABLET | ORAL | Status: DC | PRN
  Administered 2020-08-03: 50 ug via BUCCAL
  Filled 2020-08-03: qty 1

## 2020-08-03 MED ORDER — HYDROXYZINE HCL 50 MG PO TABS: 50.0000 mg | ORAL_TABLET | Freq: Four times a day (QID) | ORAL | Status: DC | PRN

## 2020-08-03 MED ORDER — ACETAMINOPHEN 325 MG PO TABS: 650.0000 mg | ORAL_TABLET | ORAL | Status: DC | PRN

## 2020-08-03 MED ORDER — LACTATED RINGERS IV SOLN: INTRAVENOUS | Status: DC

## 2020-08-03 MED ORDER — OXYCODONE-ACETAMINOPHEN 5-325 MG PO TABS: 2.0000 | ORAL_TABLET | ORAL | Status: DC | PRN

## 2020-08-03 MED ORDER — LIDOCAINE HCL (PF) 1 % IJ SOLN
INTRAMUSCULAR | Status: DC | PRN
  Administered 2020-08-03: 10 mL via EPIDURAL
  Administered 2020-08-03: 2 mL via EPIDURAL

## 2020-08-03 MED ORDER — OXYTOCIN BOLUS FROM INFUSION
333.0000 mL | Freq: Once | INTRAVENOUS | Status: AC
  Administered 2020-08-04: 333 mL via INTRAVENOUS

## 2020-08-03 MED ORDER — FENTANYL CITRATE (PF) 100 MCG/2ML IJ SOLN
50.0000 ug | INTRAMUSCULAR | Status: DC | PRN
  Administered 2020-08-03: 100 ug via INTRAVENOUS
  Administered 2020-08-03: 50 ug via INTRAVENOUS
  Administered 2020-08-04: 100 ug via INTRAVENOUS
  Filled 2020-08-03 (×3): qty 2

## 2020-08-03 MED ORDER — CALCIUM CARBONATE ANTACID 500 MG PO CHEW: 1.0000 | CHEWABLE_TABLET | Freq: Once | ORAL | Status: DC

## 2020-08-03 MED ORDER — FLEET ENEMA 7-19 GM/118ML RE ENEM: 1.0000 | ENEMA | Freq: Every day | RECTAL | Status: DC | PRN

## 2020-08-03 MED ORDER — LACTATED RINGERS IV SOLN: 500.0000 mL | INTRAVENOUS | Status: DC | PRN

## 2020-08-03 MED ORDER — LACTATED RINGERS IV SOLN
500.0000 mL | Freq: Once | INTRAVENOUS | Status: AC
  Administered 2020-08-03: 500 mL via INTRAVENOUS

## 2020-08-03 MED ORDER — LIDOCAINE HCL (PF) 1 % IJ SOLN
30.0000 mL | INTRAMUSCULAR | Status: AC | PRN
  Administered 2020-08-04: 30 mL via SUBCUTANEOUS
  Filled 2020-08-03 (×2): qty 30

## 2020-08-03 MED ORDER — OXYTOCIN-SODIUM CHLORIDE 30-0.9 UT/500ML-% IV SOLN
1.0000 m[IU]/min | INTRAVENOUS | Status: DC
  Administered 2020-08-04: 2 m[IU]/min via INTRAVENOUS

## 2020-08-03 NOTE — Progress Notes (Signed)
Labor Progress Note Carolyn Macias is a 19 y.o. G1P0 at 71w6dpresented to MAU for vaginal bleeding after IC admitted for post dates IOL.   S:Patient is doing well. No complaints or concerns at this time. Is feeling her contractions intermittently.   O:  BP 129/84   Pulse 80   Temp 98.2 F (36.8 C) (Oral)   Resp 17   Ht _0  (1.651 m)   Wt 91 kg   LMP 10/22/2019   SpO2 100%   BMI 33.40 kg/m  EFM: 160/moderate variability/+accels, no decels  CVE: Dilation: 2 Effacement (%): 50 Station: -2 Presentation: Vertex Exam by:: Dr. FSylvester Harder  A&P: 19y.o. G1P0 467w6dresented to MAU for vaginal bleeding after IC admitted for post dates IOL.  #Labor: Progressing well. Risk/benefits of FB discussed. FB placed without difficulty. Will dose with buccal cytotec. Will recheck in 4 hours or when FB falls out.   #Varicella Non-immune: will get MMR pp  #Pain: epidural #FWB: cat 1 strip #GBS negative   CaLayla BarterMD 8:31 PM

## 2020-08-03 NOTE — Anesthesia Procedure Notes (Signed)
Epidural Patient location during procedure: OB Start time: 08/03/2020 11:00 PM End time: 08/03/2020 11:08 PM  Staffing Anesthesiologist: Lannie Fields, DO Performed: anesthesiologist   Preanesthetic Checklist Completed: patient identified, IV checked, risks and benefits discussed, monitors and equipment checked, pre-op evaluation and timeout performed  Epidural Patient position: sitting Prep: DuraPrep and site prepped and draped Patient monitoring: continuous pulse ox, blood pressure, heart rate and cardiac monitor Approach: midline Location: L3-L4 Injection technique: LOR air  Needle:  Needle type: Tuohy  Needle gauge: 17 G Needle length: 9 cm Needle insertion depth: 6 cm Catheter type: closed end flexible Catheter size: 19 Gauge Catheter at skin depth: 11 cm Test dose: negative  Assessment Sensory level: T8 Events: blood not aspirated, injection not painful, no injection resistance, no paresthesia and negative IV test  Additional Notes Patient identified. Risks/Benefits/Options discussed with patient including but not limited to bleeding, infection, nerve damage, paralysis, failed block, incomplete pain control, headache, blood pressure changes, nausea, vomiting, reactions to medication both or allergic, itching and postpartum back pain. Confirmed with bedside nurse the patient's most recent platelet count. Confirmed with patient that they are not currently taking any anticoagulation, have any bleeding history or any family history of bleeding disorders. Patient expressed understanding and wished to proceed. All questions were answered. Sterile technique was used throughout the entire procedure. Please see nursing notes for vital signs. Test dose was given through epidural catheter and negative prior to continuing to dose epidural or start infusion. Warning signs of high block given to the patient including shortness of breath, tingling/numbness in hands, complete motor  block, or any concerning symptoms with instructions to call for help. Patient was given instructions on fall risk and not to get out of bed. All questions and concerns addressed with instructions to call with any issues or inadequate analgesia.  Reason for block:procedure for pain

## 2020-08-03 NOTE — Progress Notes (Signed)
RN came to discuss pt - pt here for vaginal bleeding during IC. Pt brought a picture of the bleeding, it was heavier than typical bloody show. Pt now having painful contractions q5-40min. She is scheduled for an IOL tomorrow night. Labor team Philipp Deputy, CNM) amenable to admission for labor/augmentation as needed. Admission orders for IOL already in.  Edd Arbour, CNM, MSN, [facility] 08/03/20 5:31 PM

## 2020-08-03 NOTE — Anesthesia Preprocedure Evaluation (Signed)
Anesthesia Evaluation  Patient identified by MRN, date of birth, ID band Patient awake    Reviewed: Allergy & Precautions, NPO status , Patient's Chart, lab work & pertinent test results  Airway Mallampati: II  TM Distance: >3 FB Neck ROM: Full    Dental no notable dental hx.    Pulmonary former smoker,    Pulmonary exam normal breath sounds clear to auscultation       Cardiovascular negative cardio ROS Normal cardiovascular exam Rhythm:Regular Rate:Normal     Neuro/Psych negative neurological ROS  negative psych ROS   GI/Hepatic negative GI ROS, Neg liver ROS,   Endo/Other  Obesity BMI 33  Renal/GU negative Renal ROS  negative genitourinary   Musculoskeletal negative musculoskeletal ROS (+)   Abdominal   Peds negative pediatric ROS (+)  Hematology negative hematology ROS (+) hct 36.1, plt 228   Anesthesia Other Findings   Reproductive/Obstetrics (+) Pregnancy                             Anesthesia Physical Anesthesia Plan  ASA: II and emergent  Anesthesia Plan: Epidural   Post-op Pain Management:    Induction:   PONV Risk Score and Plan: 2  Airway Management Planned: Natural Airway  Additional Equipment: None  Intra-op Plan:   Post-operative Plan:   Informed Consent: I have reviewed the patients History and Physical, chart, labs and discussed the procedure including the risks, benefits and alternatives for the proposed anesthesia with the patient or authorized representative who has indicated his/her understanding and acceptance.       Plan Discussed with:   Anesthesia Plan Comments:         Anesthesia Quick Evaluation

## 2020-08-03 NOTE — MAU Note (Signed)
This morning was having sex, and "had some really heavy bleeding and something fell out".  Thought it might be her plug.  Bleeding now is very light.  Has had some cramps, last one only lasted a few seconds.  Scheduled for induction tomorrow. Denies placental issues

## 2020-08-03 NOTE — Telephone Encounter (Signed)
Preadmission screen  

## 2020-08-03 NOTE — H&P (Addendum)
OBSTETRIC ADMISSION HISTORY AND PHYSICAL Carolyn Macias is a 19 y.o. female G1P0 with IUP at [redacted]w[redacted]d by LMP presenting for vaginal bleeding after intercourse. She has scheduled induction tomorrow. She reports +FMs, No LOF, no blurry vision, headaches or peripheral edema, and RUQ pain.  She plans on breast feeding. She in unsure what she would like for for birth control. She received her initial prenatal care in [place] and has not established care with anyone since moving to [place] at 27 weeks due to insurance approval.    Dating: By LMP --->  Estimated Date of Delivery: 07/28/20  Sono:    @[redacted]w[redacted]d , CWD, normal anatomy, cephalic presentation, 62% EFW   Prenatal History/Complications:  -post dates pregnancy at [redacted]w[redacted]d -limited prenatal care  -GBS negative -unknown maternal diabetic status -GERD with pregnancy  Past Medical History: Past Medical History:  Diagnosis Date  . Chlamydia     Past Surgical History: Past Surgical History:  Procedure Laterality Date  . NO PAST SURGERIES      Obstetrical History: OB History    Gravida  1   Para      Term      Preterm      AB      Living        SAB      TAB      Ectopic      Multiple      Live Births              Social History Social History   Socioeconomic History  . Marital status: Single    Spouse name: Not on file  . Number of children: Not on file  . Years of education: Not on file  . Highest education level: Not on file  Occupational History  . Not on file  Tobacco Use  . Smoking status: Former [redacted]w[redacted]d  . Smokeless tobacco: Never Used  Substance and Sexual Activity  . Alcohol use: Never  . Drug use: Never  . Sexual activity: Not on file  Other Topics Concern  . Not on file  Social History Narrative  . Not on file   Social Determinants of Health   Financial Resource Strain:   . Difficulty of Paying Living Expenses: Not on file  Food Insecurity:   . Worried About Games developer in the Last Year: Not on file  . Ran Out of Food in the Last Year: Not on file  Transportation Needs:   . Lack of Transportation (Medical): Not on file  . Lack of Transportation (Non-Medical): Not on file  Physical Activity:   . Days of Exercise per Week: Not on file  . Minutes of Exercise per Session: Not on file  Stress:   . Feeling of Stress : Not on file  Social Connections:   . Frequency of Communication with Friends and Family: Not on file  . Frequency of Social Gatherings with Friends and Family: Not on file  . Attends Religious Services: Not on file  . Active Member of Clubs or Organizations: Not on file  . Attends Brewing technologist Meetings: Not on file  . Marital Status: Not on file    Family History: No family history on file.  Allergies: No Known Allergies  Medications Prior to Admission  Medication Sig Dispense Refill Last Dose  . calcium carbonate (TUMS - DOSED IN MG ELEMENTAL CALCIUM) 500 MG chewable tablet Chew 1 tablet by mouth daily.   08/03/2020 at Unknown time  .  acetaminophen (TYLENOL) 500 MG tablet Take 500 mg by mouth every 6 (six) hours as needed.     . cyclobenzaprine (FLEXERIL) 10 MG tablet Take 1 tablet (10 mg total) by mouth 3 (three) times daily as needed for muscle spasms. 20 tablet 0   . Prenatal Vit-Fe Fumarate-FA (MULTIVITAMIN-PRENATAL) 27-0.8 MG TABS tablet Take 1 tablet by mouth daily at 12 noon.        Review of Systems   All systems reviewed and negative except as stated in HPI  Blood pressure 131/64, pulse 85, temperature 98.2 F (36.8 C), temperature source Oral, resp. rate 16, height 5\' 5"  (1.651 m), weight 91 kg, last menstrual period 10/22/2019, SpO2 100 %. General appearance: alert, cooperative and no distress Chest: normal respiratory effort  Abdomen: soft, non-tender; gravid Extremities: no sign of DVT, no LE edema noted bilaterally  Presentation: cephalic Fetal monitoringBaseline: 135 bpm, Variability: Good {> 6 bpm),  Accelerations: Reactive and Decelerations: Absent Uterine activity: intermittent  Dilation: 1 Exam by:: [email or web address], RN   Prenatal labs: ABO, Rh: --/--/O POS (11/01 1521) Antibody: NEG (11/01 1521) Rubella: <0.90 (11/01 1521) RPR: NON REACTIVE (11/01 1521)  HBsAg: NON REACTIVE (11/01 1521)  HIV: Non Reactive (11/01 1521)  GBS:   negative (07/31/2020) 1 hr Glucola: not performed  Genetic screening: not performed  Anatomy 08/02/2020: normal (08/01/2020)  Prenatal Transfer Tool  Maternal Diabetes: unknown Genetic Screening: not performed Maternal Ultrasounds/Referrals: Normal Fetal Ultrasounds or other Referrals:  Referred to Materal Fetal Medicine  Maternal Substance Abuse:  No Significant Maternal Medications:  None Significant Maternal Lab Results: Group B Strep negative  No results found for this or any previous visit (from the past 24 hour(s)).  Patient Active Problem List   Diagnosis Date Noted  . Limited prenatal care in third trimester 07/31/2020  . Post term pregnancy over 40 weeks 07/31/2020  . Rubella non-immune status, antepartum 07/06/2020    Assessment/Plan:  Carolyn Macias is a 19 y.o. G1P0 at [redacted]w[redacted]d here for new onset vaginal bleeding, originally scheduled for an induction on 08/04/2020.  #Labor: Will start cytotec and insert folley balloon. Continue to monitor progression. #Pain: epidural desired #FWB: Category 1 #ID: GBS negative  #MOF: breast #MOC: Undecided, discussed multiple options with patient and needs time to decide. #Circ: N/a (girl) #Limited prenatal care: consider SW consult following delivery. #Unknown glucose status: Most recent CMP notable for glucose 91 wnl, consider obtaining CMP.  #Reflux: one-time dose of tums given   14/09/2019, DO  08/03/2020, 5:26 PM  CNM attestation:  I have seen and examined this patient; I agree with above documentation in the resident's note.   Carolyn Macias is a 19 y.o. G1P0 here for IOL due to previous vag  bldg (has since stopped) & postdates  PE: BP 104/65   Pulse 92   Temp 98.2 F (36.8 C) (Oral)   Resp 16   Ht 5\' 5"  (1.651 m)   Wt 91 kg   LMP 10/22/2019   SpO2 100%   BMI 33.40 kg/m  Gen: calm comfortable, NAD Resp: normal effort, no distress Abd: gravid Bedside u/s: confirmed vtx  ROS, labs, PMH reviewed  Plan: Admit to Labor and Delivery Plan for cx ripening with foley/cytotec to start, and then progress to Pit/AROM prn Anticipate vag del  CNM 08/03/2020, 7:12 PM

## 2020-08-04 ENCOUNTER — Encounter (HOSPITAL_COMMUNITY): Payer: Self-pay | Admitting: Obstetrics & Gynecology

## 2020-08-04 DIAGNOSIS — Z3A41 41 weeks gestation of pregnancy: Secondary | ICD-10-CM

## 2020-08-04 DIAGNOSIS — O480 Post-term pregnancy: Secondary | ICD-10-CM

## 2020-08-04 LAB — RPR: RPR Ser Ql: NONREACTIVE

## 2020-08-04 MED ORDER — WITCH HAZEL-GLYCERIN EX PADS: 1.0000 "application " | MEDICATED_PAD | CUTANEOUS | Status: DC | PRN

## 2020-08-04 MED ORDER — PRENATAL MULTIVITAMIN CH
1.0000 | ORAL_TABLET | Freq: Every day | ORAL | Status: DC
  Administered 2020-08-06: 1 via ORAL
  Filled 2020-08-04: qty 1

## 2020-08-04 MED ORDER — IBUPROFEN 600 MG PO TABS
600.0000 mg | ORAL_TABLET | Freq: Four times a day (QID) | ORAL | Status: DC
  Administered 2020-08-04 – 2020-08-06 (×7): 600 mg via ORAL
  Filled 2020-08-04 (×8): qty 1

## 2020-08-04 MED ORDER — MAGNESIUM HYDROXIDE 400 MG/5ML PO SUSP: 30.0000 mL | ORAL | Status: DC | PRN

## 2020-08-04 MED ORDER — DIBUCAINE (PERIANAL) 1 % EX OINT: 1.0000 "application " | TOPICAL_OINTMENT | CUTANEOUS | Status: DC | PRN

## 2020-08-04 MED ORDER — BENZOCAINE-MENTHOL 20-0.5 % EX AERO
1.0000 "application " | INHALATION_SPRAY | CUTANEOUS | Status: DC | PRN
  Administered 2020-08-04: 1 via TOPICAL
  Filled 2020-08-04: qty 56

## 2020-08-04 MED ORDER — DIPHENHYDRAMINE HCL 25 MG PO CAPS: 25.0000 mg | ORAL_CAPSULE | Freq: Four times a day (QID) | ORAL | Status: DC | PRN

## 2020-08-04 MED ORDER — COCONUT OIL OIL: 1.0000 "application " | TOPICAL_OIL | Status: DC | PRN

## 2020-08-04 MED ORDER — TETANUS-DIPHTH-ACELL PERTUSSIS 5-2.5-18.5 LF-MCG/0.5 IM SUSY
0.5000 mL | PREFILLED_SYRINGE | Freq: Once | INTRAMUSCULAR | Status: AC
  Administered 2020-08-06: 0.5 mL via INTRAMUSCULAR
  Filled 2020-08-04: qty 0.5

## 2020-08-04 MED ORDER — MEASLES, MUMPS & RUBELLA VAC IJ SOLR
0.5000 mL | Freq: Once | INTRAMUSCULAR | Status: AC
  Administered 2020-08-06: 0.5 mL via SUBCUTANEOUS
  Filled 2020-08-04: qty 0.5

## 2020-08-04 MED ORDER — SENNOSIDES-DOCUSATE SODIUM 8.6-50 MG PO TABS
2.0000 | ORAL_TABLET | ORAL | Status: DC
  Administered 2020-08-04 – 2020-08-05 (×2): 2 via ORAL
  Filled 2020-08-04 (×2): qty 2

## 2020-08-04 MED ORDER — SIMETHICONE 80 MG PO CHEW: 80.0000 mg | CHEWABLE_TABLET | ORAL | Status: DC | PRN

## 2020-08-04 MED ORDER — ACETAMINOPHEN 325 MG PO TABS
650.0000 mg | ORAL_TABLET | ORAL | Status: DC | PRN
  Administered 2020-08-05: 650 mg via ORAL
  Filled 2020-08-04: qty 2

## 2020-08-04 MED ORDER — ONDANSETRON HCL 4 MG PO TABS: 4.0000 mg | ORAL_TABLET | ORAL | Status: DC | PRN

## 2020-08-04 MED ORDER — OXYTOCIN-SODIUM CHLORIDE 30-0.9 UT/500ML-% IV SOLN: 1.0000 m[IU]/min | INTRAVENOUS | Status: DC

## 2020-08-04 MED ORDER — ONDANSETRON HCL 4 MG/2ML IJ SOLN: 4.0000 mg | INTRAMUSCULAR | Status: DC | PRN

## 2020-08-04 NOTE — Progress Notes (Signed)
Labor Progress Note Carolyn Macias is a 19 y.o. G1P0 at 87w6dpresented to MAU for vaginal bleeding after IC admitted for post dates IOL.   S:Patient doing well. Feeling contractions more consistently. Epidural in place. Otherwise no complaints or concerns.   O:  BP (!) 112/56   Pulse 71   Temp 97.7 F (36.5 C) (Oral)   Resp 16   Ht _0  (1.651 m)   Wt 91 kg   LMP 10/22/2019   SpO2 100%   BMI 33.40 kg/m  EFM: 130/moderate variability/+accels, no decels Toco: q3-4 min  CVE: Dilation: 7 Effacement (%): 90 Station: -1 Presentation: Vertex Exam by:: AConsuela Mimes RN/Dr. BJaclynn Guarneri A&P: 19y.o. G1P0 437w6dresented to MAU for vaginal bleeding after IC admitted for post dates IOL.   #Labor: Progressing well. Will continue expectant labor management. Will check again in 2h. If no change at that time, will consider AROM vs pitocin.   #Varicella Non-immune: will get MMR pp  #Pain: epidural #FWB: cat 1 strip #GBS negative  CaLayla BarterMD 12:46 AM

## 2020-08-04 NOTE — Progress Notes (Addendum)
Labor Progress Note Carolyn Macias is a 19 y.o. G1P0 at 37w6dpresented to MAU for vaginal bleeding after IC admitted for post dates IOL.   S:Patient doing well. No complaints or concerns at this time.  O:  BP (!) 96/50   Pulse 81   Temp 97.7 F (36.5 C) (Oral)   Resp 17   Ht '5\' 5"'  (1.651 m)   Wt 91 kg   LMP 10/22/2019   SpO2 100%   BMI 33.40 kg/m  EFM: 140/moderate variability/+accels, no decels Toco: q2-3 min  CVE: Dilation: 9 Effacement (%): 90 Station: -1, 0 Presentation: Vertex Exam by:: AConsuela Mimes RN  A&P: 19y.o. G1P0 454w6dresented to MAU for vaginal bleeding after IC admitted for post dates IOL.   #Labor: Progressing well. Will continue expectant labor management. Will check again in 2h. If no change at that time, will consider AROM vs pitocin.   #Rubella Non-immune: will get MMR pp  #Pain: epidural #FWB: cat 1 strip #GBS negative  CaLayla BarterMD 4:16 AM

## 2020-08-04 NOTE — Discharge Summary (Addendum)
Postpartum Discharge Summary    Patient Name: Carolyn Macias DOB: 2000-12-10 MRN: [id number]  Date of admission: 08/03/2020 Delivery date:08/04/2020  Delivering provider: Janet Berlin  Date of discharge: 08/06/2020  Admitting diagnosis: Post term pregnancy over 40 weeks [O48.0] Intrauterine pregnancy: [redacted]w[redacted]d    Secondary diagnosis:  Active Problems:   Post term pregnancy over 40 weeks  Additional problems: none    Discharge diagnosis: Term Pregnancy Delivered                                              Post partum procedures: none Augmentation: Pitocin Complications: None  Hospital course: Onset of Labor With Vaginal Delivery      19y.o. yo G1P0 at 447w0das admitted in Latent Labor on 08/03/2020. Patient had an uncomplicated labor course as follows:  Membrane Rupture Time/Date: 5:01 AM ,08/04/2020   Delivery Method:Vaginal, Spontaneous  Episiotomy: None  Lacerations:  1st degree  Patient had an uncomplicated postpartum course.  She is ambulating, tolerating a regular diet, passing flatus, and urinating well. Patient is discharged home in stable condition on 08/06/20.  Newborn Data: Birth date:08/04/2020  Birth time:3:08 PM  Gender:Female  Living status:Living  Apgars:4 ,9  Weight:3305 g   Magnesium Sulfate received: No BMZ received: No Rhophylac:N/A MMR:Yes T-DaP:Given prenatally Flu: No Transfusion:No  Physical exam  Vitals:   08/05/20 0340 08/05/20 0905 08/05/20 2030 08/06/20 0505  BP: (!) 117/58 118/71 120/75 110/67  Pulse: 79 77 77 67  Resp: '18 18 16 16  ' Temp: 97.9 F (36.6 C) (!) 97.5 F (36.4 C) 98 F (36.7 C) 97.9 F (36.6 C)  TempSrc: Oral Oral Oral Oral  SpO2: 100%  100% 98%  Weight:      Height:       General: alert Lochia: appropriate Uterine Fundus: firm Incision: N/A DVT Evaluation: No evidence of DVT seen on physical exam. Labs: Lab Results  Component Value Date   WBC 13.5 (H) 08/03/2020   HGB 11.5 (L) 08/03/2020   HCT 36.1  08/03/2020   MCV 93.3 08/03/2020   PLT 228 08/03/2020   CMP Latest Ref Rng & Units 08/03/2020  Glucose 70 - 99 mg/dL 67(L)  BUN 6 - 20 mg/dL 9  Creatinine 0.44 - 1.00 mg/dL 0.66  Sodium 135 - 145 mmol/L 135  Potassium 3.5 - 5.1 mmol/L 3.6  Chloride 98 - 111 mmol/L 104  CO2 22 - 32 mmol/L 20(L)  Calcium 8.9 - 10.3 mg/dL 9.2  Total Protein 6.5 - 8.1 g/dL 6.7  Total Bilirubin 0.3 - 1.2 mg/dL 0.6  Alkaline Phos 38 - 126 U/L 111  AST 15 - 41 U/L 19  ALT 0 - 44 U/L 15   Edinburgh Score: Edinburgh Postnatal Depression Scale Screening Tool 08/05/2020  I have been able to laugh and see the funny side of things. 0  I have looked forward with enjoyment to things. 0  I have blamed myself unnecessarily when things went wrong. 2  I have been anxious or worried for no good reason. 1  I have felt scared or panicky for no good reason. 1  Things have been getting on top of me. 0  I have been so unhappy that I have had difficulty sleeping. 1  I have felt sad or miserable. 0  I have been so unhappy that I have been crying. 0  The thought  of harming myself has occurred to me. 0  Edinburgh Postnatal Depression Scale Total 5    After visit meds:  Allergies as of 08/06/2020   No Known Allergies      Medication List     STOP taking these medications    cyclobenzaprine 10 MG tablet Commonly known as: FLEXERIL       TAKE these medications    acetaminophen 500 MG tablet Commonly known as: TYLENOL Take 500 mg by mouth every 6 (six) hours as needed. What changed: Another medication with the same name was added. Make sure you understand how and when to take each.   acetaminophen 325 MG tablet Commonly known as: Tylenol Take 2 tablets (650 mg total) by mouth every 4 (four) hours as needed (for pain scale < 4). What changed: You were already taking a medication with the same name, and this prescription was added. Make sure you understand how and when to take each.   calcium carbonate 500  MG chewable tablet Commonly known as: TUMS - dosed in mg elemental calcium Chew 1 tablet by mouth daily.   coconut oil Oil Apply 1 application topically as needed.   multivitamin-prenatal 27-0.8 MG Tabs tablet Take 1 tablet by mouth daily at 12 noon.        Discharge home in stable condition Infant Feeding: Breast Infant Disposition:home with mother Discharge instruction: per After Visit Summary and Postpartum booklet. Activity: Advance as tolerated. Pelvic rest for 6 weeks.  Diet: routine diet Future Appointments:No future appointments. Follow up Visit:   Please schedule this patient for a In person postpartum visit in 6 weeks with the following provider: Any provider. Additional Postpartum F/U: none   Low risk pregnancy complicated by:  limited prenatal care Delivery mode:  Vaginal, Spontaneous  Anticipated Birth Control:  Unsure - has had problems with depo/OCPs in the past, is considering POPs or IUD to start at pp appt.   08/06/2020 [name], MD  Attestation of Supervision of Student:  I confirm that I have verified the information documented in the medical student's note and that I have also personally performed the history, physical exam and all medical decision making activities.  I have verified that all services and findings are accurately documented in this student's note; and I agree with management and plan as outlined in the documentation. I have also made any necessary editorial changes.  Gabriel Carina, [place] for [employer], [employer] 08/06/2020 10:45 AM

## 2020-08-04 NOTE — Progress Notes (Addendum)
Labor Progress Note Rmani Kapusta is a 19 y.o. G1P0 at [redacted]w[redacted]d presented to MAU for vaginal bleeding and later admitted for post dates IOL.  S:  Patient doing well, no concerns at this time.   O:  BP (!) 136/112   Pulse 100   Temp 99.8 F (37.7 C)   Resp 16   Ht 5\' 5"  (1.651 m)   Wt 91 kg   LMP 10/22/2019   SpO2 100%   BMI 33.40 kg/m  EFM: 150bpm/moderate variability/accels without decels  Contractions about q5-10 min  CVE: Dilation: 10 Dilation Complete Date: 08/04/20 Dilation Complete Time: 1000 Effacement (%): 90 Station: Plus 2 Presentation: Vertex Exam by:: Sharene Skeans   A&P: 19 y.o. G1P0 [redacted]w[redacted]d presented to MAU for vaginal bleeding and later admitted for post dates IOL.   #Labor: Progressed to complete status. AROM@0501 . Multiple practice pushes completed.  Variable contraction pattern with spaced out contractions. Started Pit at 1000 due to suboptimal contraction pattern.   #Pain: epidural #FWB: category 1  #GBS negative #Rubella: non-immune, MMR planned postpartum   Anupa Ganta, DO 10:40 AM  I saw and evaluated the patient. I agree with the findings and the plan of care as documented in the resident's note.  Sharene Skeans, MD [facility] Family Medicine Fellow, [facility] for [facility], [place]

## 2020-08-04 NOTE — Progress Notes (Signed)
Labor Progress Note Carolyn Macias is a 19 y.o. G1P0 at [redacted]w[redacted]d presented to MAU for vaginal bleeding after IC admitted for post dates IOL.   S:Patient doing well. No complaints or concerns at this time.  O:  BP 120/68   Pulse 89   Temp 98.6 F (37 C) (Oral)   Resp 17   Ht $R'5\' 5"'Fp$  (1.651 m)   Wt 91 kg   LMP 10/22/2019   SpO2 100%   BMI 33.40 kg/m  EFM: 140/moderate variability/+accels, no decels Toco: q2-3 min  CVE: Dilation: Lip/rim Effacement (%): 90 Station: 0 Presentation: Vertex Exam by:: Dr. Sylvester Harder  A&P: 19 y.o. G1P0 [redacted]w[redacted]d presented to MAU for vaginal bleeding after IC admitted for post dates IOL.   #Labor: Progressing well. AROM @ 0501. Will re-examine in 2 hours or sooner if patient calls out. Can consider pitocin if contractions space out.   #Rubella Non-immune: will get MMR pp  #Pain: epidural #FWB: cat 1 strip #GBS negative  [name], MD 6:01 AM

## 2020-08-05 ENCOUNTER — Encounter (HOSPITAL_COMMUNITY): Payer: Medicaid Other

## 2020-08-05 DIAGNOSIS — Z8759 Personal history of other complications of pregnancy, childbirth and the puerperium: Secondary | ICD-10-CM

## 2020-08-05 NOTE — Lactation Note (Signed)
This note was copied from a baby's chart. Lactation Consultation Note  Patient Name: Carolyn Macias SAYTK'Z Date: 08/05/2020 Reason for consult: Initial assessment   Mother is a P9, infant is 29  hours old and is now Mother was given [facility] brochure and basic teaching done.  Mother reports that infant is feeding well.  Reviewed hand expression with mother. Observed large drops of colostrum. nipples are slightly pink.    Explained feeding cues to parents.   Mother to continue to cue base feed infant and feed at least 8-12 times or more in 24 hours and advised to allow for cluster feeding infant as needed.  Mother to continue to due STS. Mother is aware of available LC services at [facility], BFSG'S, OP Dept, and phone # for questions or concerns about breastfeeding.  Mother receptive to all teaching and plan of care.     Maternal Data Has patient been taught Hand Expression?: Yes Does the patient have breastfeeding experience prior to this delivery?: No  Feeding Feeding Type: Breast Fed  LATCH Score Latch: Repeated attempts needed to sustain latch, nipple held in mouth throughout feeding, stimulation needed to elicit sucking reflex.  Audible Swallowing: A few with stimulation  Type of Nipple: Everted at rest and after stimulation (R short shaft nipple)  Comfort (Breast/Nipple): Soft / non-tender  Hold (Positioning): Assistance needed to correctly position infant at breast and maintain latch.  LATCH Score: 7  Interventions Interventions: Breast feeding basics reviewed;Hand express  Lactation Tools Discussed/Used WIC Program: No   Consult Status Consult Status: Follow-up Date: 08/05/20 Follow-up type: In-patient    Stevan Born [facility] 08/05/2020, 8:37 AM

## 2020-08-05 NOTE — Progress Notes (Addendum)
CSW received records from [facility]. CSW provided records to Pediatrician  in which Pediatrician noted that MOB only appeared to have had two PNC  visits. CSW updated MOB of this and reiterated hospital drug screen policy as records indicated only two visits. MOB expressed understanding but also reported that she would look in her "MyChart" account to locate other records.   12:16pm-CSW received call requesting that CSW come speak with MOB again at MOB's request. CSW was made aware that MOB had located more PNC Records at [facility]. CSW spoke with Nicole from facility and was asked to send over another release form to her so that records can be sent over. CSW to send over release that MOB has signed to obtain further PNC records.   12:35pm- CSW spoke with Nicole from [facility] and was advised that fax had been received. CSW awaiting call back from Nicole at this time.     4:29pm- CSW received call back from Nicole with [facility] advising CSW that records that she  has on file for MOB are the same dates/records that were sent over to CSW from [facility]. CSW noted that MOB has two PNC visits as well as two ultra sounds visits. CSW attempting to follow up/confirm with Pediatrician whether pediatrician will count ultra sounds as PNC visits (this is at Pediatricians discretion) or only PNC visits that are listed. CSW also updated RN of the desire to confirm this at this time.    Carolyn Macias, MSW, LCSW [facility] [place] [phone]   

## 2020-08-05 NOTE — Anesthesia Postprocedure Evaluation (Signed)
Anesthesia Post Note  Patient: Donta Mcinroy  Procedure(s) Performed: AN AD HOC LABOR EPIDURAL     Patient location during evaluation: Mother Baby Anesthesia Type: Epidural Level of consciousness: awake and alert Pain management: pain level controlled Vital Signs Assessment: post-procedure vital signs reviewed and stable Respiratory status: spontaneous breathing, nonlabored ventilation and respiratory function stable Cardiovascular status: stable Postop Assessment: no headache, no backache and epidural receding Anesthetic complications: no   No complications documented.  Last Vitals:  Vitals:   08/05/20 0340 08/05/20 0905  BP: (!) 117/58 118/71  Pulse: 79 77  Resp: 18 18  Temp: 36.6 C (!) 36.4 C  SpO2: 100%     Last Pain:  Vitals:   08/05/20 0905  TempSrc: Oral  PainSc:    Pain Goal:                Epidural/Spinal Function Cutaneous sensation: (P) Normal sensation (08/05/20 0900), Patient able to flex knees: (P) Yes (08/05/20 0900), Patient able to lift hips off bed: (P) Yes (08/05/20 0900), Back pain beyond tenderness at insertion site: (P) No (08/05/20 0900), Progressively worsening motor and/or sensory loss: (P) No (08/05/20 0900), Bowel and/or bladder incontinence post epidural: (P) No (08/05/20 0900)  Amana Bouska

## 2020-08-05 NOTE — Clinical Social Work Maternal (Addendum)
CLINICAL SOCIAL WORK MATERNAL/CHILD NOTE  Patient Details  Name: Carolyn Macias MRN: [id number] Date of Birth: 2001/01/11  Date:  08/05/2020  Clinical Social Worker Initiating Note:  Carolyn Pilar, LCSW Date/Time: Initiated:  08/05/20/0930     Child's Name:  Marcy Panning   Biological Parents:  Mother, Father Mangus Landover, Sharlee Blew)   Need for Interpreter:  None   Reason for Referral:  Late or No Prenatal Care    Address:  [address] [place] [address]    Phone number:  [phone] (home)     Additional phone number: none   Household Members/Support Persons (HM/SP):   Household Member/Support Person 1   HM/SP Name Relationship DOB or Age  HM/SP -1  Reather Steller MOB 06/06/1999  HM/SP -2 Sharlee Blew FOB 11/23/1996  HM/SP -3        HM/SP -4        HM/SP -5        HM/SP -6        HM/SP -7        HM/SP -8          Natural Supports (not living in the home):  Immediate Family   Professional Supports: None   Employment: Unemployed   Type of Work: none reported   Education:  High school graduate   Homebound arranged:  n/a  Surveyor, quantity Resources:  Medicaid   Other Resources:  WIC (plans to apply for [facility].)   Cultural/Religious Considerations Which May Impact Care:  none   Strengths:  Ability to meet basic needs , Compliance with medical plan , Home prepared for child , Pediatrician chosen   Psychotropic Medications:      None reported.    Pediatrician:    [place]  Pediatrician List:   [facility] Other [facility])  [place]      Pediatrician Fax Number:    Risk Factors/Current Problems:  None   Cognitive State:  Insightful , Able to Concentrate , Alert    Mood/Affect:  Relaxed , Comfortable , Calm , Interested    CSW Assessment: CSW consulted due to MOB getting Limited PNC this pregnancy. CSW went speak with MOB at  bedside to address further concerns and needs.   CSW congratulated MOB on the birth of infant. CSW advised MOB of CSW's role and the reason for CSW coming to speak with her. MOB expressed that she received [facility] in [place]. MOB expressed that she had "3-4 visits" with this clinic. CSW understanding  And asked MOB about records for this. MOB expressed that she would call [facility] to get records, however CSW offered to assist with this. CSW and MOB spoke with Vinnie Langton in Medical Records with [facility] and was advised that CSW would need to send over release form in order for these records to be sent to CSW. CSW understanding of this and had MOB sign "Patinet Release for Access" form. CSW advised MOB that if records were not obtained or if records show less than three visits then infant would be drug screened. CSW advised MOB that if infants UDS or CDS returns positive for any substances that MOB was not given or prescribed by an MD while pregnant, then CSW would need to make a CPS report. MOB expressed that she understood and expressed no substances use while pregnant. MOB denies having any previous  or current CPS hx to this CSW .  CSW inquired from [facility] on her mental health hx. MOB expressed that she has a hx of anxiety and depression as "I lost my father". MOB expressed that she was in therapy in the past for this however expressed that she hasn't been in therapy recently. MOB was offered resources for therapy in which MOB indicated that she is able to locate own therapist as needed. MOB expressed that she has no other mental health hx and denies feelings of SI, HI or being involved in DV.   CSW was advised that MOB has support from her grandmother at this time. MOB expressed that she has all needed items to care for infant with plans for infant to sleep in basinet once arrived home.  CSW provided MOB with PPD and SIDS education. MOB was given PPD Checklist in order to  keep track of feelings as they relate to PPD. MOB expressed no other needs.   CSW will continue to monitor infants UDS and CDS and make CPS report if warranted.   CSW Plan/Description:  No Further Intervention Required/No Barriers to Discharge, Perinatal Mood and Anxiety Disorder (PMADs) Education, Neonatal Abstinence Syndrome (NAS) Education, CSW Will Continue to Monitor Umbilical Cord Tissue Drug Screen Results and Make Report if [facility] Drug Screen Policy Information    Loralie Champagne 08/05/2020, 11:46 AM

## 2020-08-05 NOTE — Progress Notes (Addendum)
POSTPARTUM PROGRESS NOTE  Subjective: Carolyn Macias is a 19 y.o. G1P1001 s/p SVDat [redacted]w[redacted]d.  She reports she doing well. No acute events overnight. She denies any problems with ambulating, voiding or po intake. Denies nausea or vomiting. She has passed flatus. Pain is well controlled.  Lochia is moderate, no passage of large clots.  Objective: Blood pressure (!) 117/58, pulse 79, temperature 97.9 F (36.6 C), temperature source Oral, resp. rate 18, height 5\' 5"  (1.651 m), weight 91 kg, last menstrual period 10/22/2019, SpO2 100 %, unknown if currently breastfeeding.  Physical Exam:  General: alert, cooperative and no distress Chest: no respiratory distress Abdomen: soft, non-tender  Uterine Fundus: firm and at level of umbilicus Extremities: No calf swelling or tenderness  No edema  Recent Labs    08/03/20 1816  HGB 11.5*  HCT 36.1    Assessment/Plan: Carolyn Macias is a 19 y.o. G1P1001 s/p SVD at [redacted]w[redacted]d for pd IOL.  Routine Postpartum Care: Doing well, pain well-controlled.  -- Continue routine care, lactation support  -- Contraception:none -- Feeding: breast  Dispo: Plan for discharge home tomorrow.  [redacted]w[redacted]d, MD 08/05/2020 7:00 AM   GME ATTESTATION:  I saw and evaluated the patient. I agree with the findings and the plan of care as documented in the resident's note.  14/10/2019, MD OB Fellow, Faculty [facility], Center for [facility] 08/05/2020 7:31 AM

## 2020-08-06 DIAGNOSIS — Z8759 Personal history of other complications of pregnancy, childbirth and the puerperium: Secondary | ICD-10-CM

## 2020-08-06 MED ORDER — ACETAMINOPHEN 325 MG PO TABS
650.0000 mg | ORAL_TABLET | ORAL | Status: DC | PRN
Start: 2020-08-06 — End: 2020-11-02

## 2020-08-06 MED ORDER — COCONUT OIL OIL
1.0000 | TOPICAL_OIL | 0 refills | Status: DC | PRN
Start: 2020-08-06 — End: 2020-11-02

## 2020-08-06 NOTE — Discharge Instructions (Signed)
Postpartum Care After Vaginal Delivery This sheet gives you information about how to care for yourself from the time you deliver your baby to up to 6-12 weeks after delivery (postpartum period). Your health care provider may also give you more specific instructions. If you have problems or questions, contact your health care provider. Follow these instructions at home: Vaginal bleeding  It is normal to have vaginal bleeding (lochia) after delivery. Wear a sanitary pad for vaginal bleeding and discharge. ? During the first week after delivery, the amount and appearance of lochia is often similar to a menstrual period. ? Over the next few weeks, it will gradually decrease to a dry, yellow-brown discharge. ? For most women, lochia stops completely by 4-6 weeks after delivery. Vaginal bleeding can vary from woman to woman.  Change your sanitary pads frequently. Watch for any changes in your flow, such as: ? A sudden increase in volume. ? A change in color. ? Large blood clots.  If you pass a blood clot from your vagina, save it and call your health care provider to discuss. Do not flush blood clots down the toilet before talking with your health care provider.  Do not use tampons or douches until your health care provider says this is safe.  If you are not breastfeeding, your period should return 6-8 weeks after delivery. If you are feeding your child breast milk only (exclusive breastfeeding), your period may not return until you stop breastfeeding. Perineal care  Keep the area between the vagina and the anus (perineum) clean and dry as told by your health care provider. Use medicated pads and pain-relieving sprays and creams as directed.  If you had a cut in the perineum (episiotomy) or a tear in the vagina, check the area for signs of infection until you are healed. Check for: ? More redness, swelling, or pain. ? Fluid or blood coming from the cut or tear. ? Warmth. ? Pus or a bad  smell.  You may be given a squirt bottle to use instead of wiping to clean the perineum area after you go to the bathroom. As you start healing, you may use the squirt bottle before wiping yourself. Make sure to wipe gently.  To relieve pain caused by an episiotomy, a tear in the vagina, or swollen veins in the anus (hemorrhoids), try taking a warm sitz bath 2-3 times a day. A sitz bath is a warm water bath that is taken while you are sitting down. The water should only come up to your hips and should cover your buttocks. Breast care  Within the first few days after delivery, your breasts may feel heavy, full, and uncomfortable (breast engorgement). Milk may also leak from your breasts. Your health care provider can suggest ways to help relieve the discomfort. Breast engorgement should go away within a few days.  If you are breastfeeding: ? Wear a bra that supports your breasts and fits you well. ? Keep your nipples clean and dry. Apply creams and ointments as told by your health care provider. ? You may need to use breast pads to absorb milk that leaks from your breasts. ? You may have uterine contractions every time you breastfeed for up to several weeks after delivery. Uterine contractions help your uterus return to its normal size. ? If you have any problems with breastfeeding, work with your health care provider or lactation consultant.  If you are not breastfeeding: ? Avoid touching your breasts a lot. Doing this can make   your breasts produce more milk. ? Wear a good-fitting bra and use cold packs to help with swelling. ? Do not squeeze out (express) milk. This causes you to make more milk. Intimacy and sexuality  Ask your health care provider when you can engage in sexual activity. This may depend on: ? Your risk of infection. ? How fast you are healing. ? Your comfort and desire to engage in sexual activity.  You are able to get pregnant after delivery, even if you have not had  your period. If desired, talk with your health care provider about methods of birth control (contraception). Medicines  Take over-the-counter and prescription medicines only as told by your health care provider.  If you were prescribed an antibiotic medicine, take it as told by your health care provider. Do not stop taking the antibiotic even if you start to feel better. Activity  Gradually return to your normal activities as told by your health care provider. Ask your health care provider what activities are safe for you.  Rest as much as possible. Try to rest or take a nap while your baby is sleeping. Eating and drinking   Drink enough fluid to keep your urine pale yellow.  Eat high-fiber foods every day. These may help prevent or relieve constipation. High-fiber foods include: ? Whole grain cereals and breads. ? Brown rice. ? Beans. ? Fresh fruits and vegetables.  Do not try to lose weight quickly by cutting back on calories.  Take your prenatal vitamins until your postpartum checkup or until your health care provider tells you it is okay to stop. Lifestyle  Do not use any products that contain nicotine or tobacco, such as cigarettes and e-cigarettes. If you need help quitting, ask your health care provider.  Do not drink alcohol, especially if you are breastfeeding. General instructions  Keep all follow-up visits for you and your baby as told by your health care provider. Most women visit their health care provider for a postpartum checkup within the first 3-6 weeks after delivery. Contact a health care provider if:  You feel unable to cope with the changes that your child brings to your life, and these feelings do not go away.  You feel unusually sad or worried.  Your breasts become red, painful, or hard.  You have a fever.  You have trouble holding urine or keeping urine from leaking.  You have little or no interest in activities you used to enjoy.  You have not  breastfed at all and you have not had a menstrual period for 12 weeks after delivery.  You have stopped breastfeeding and you have not had a menstrual period for 12 weeks after you stopped breastfeeding.  You have questions about caring for yourself or your baby.  You pass a blood clot from your vagina. Get help right away if:  You have chest pain.  You have difficulty breathing.  You have sudden, severe leg pain.  You have severe pain or cramping in your lower abdomen.  You bleed from your vagina so much that you fill more than one sanitary pad in one hour. Bleeding should not be heavier than your heaviest period.  You develop a severe headache.  You faint.  You have blurred vision or spots in your vision.  You have bad-smelling vaginal discharge.  You have thoughts about hurting yourself or your baby. If you ever feel like you may hurt yourself or others, or have thoughts about taking your own life, get help   right away. You can go to the nearest emergency department or call:  Your local emergency services (911 in the [place]  A suicide crisis helpline, such as the National Suicide Prevention Lifeline at 1-800-273-8255. This is open 24 hours a day. Summary  The period of time right after you deliver your newborn up to 6-12 weeks after delivery is called the postpartum period.  Gradually return to your normal activities as told by your health care provider.  Keep all follow-up visits for you and your baby as told by your health care provider. This information is not intended to replace advice given to you by your health care provider. Make sure you discuss any questions you have with your health care provider. Document Revised: 08/24/2017 Document Reviewed: 06/04/2017 Elsevier Patient Education  2020 Elsevier Inc.  Postpartum Baby Blues The postpartum period begins right after the birth of a baby. During this time, there is often a lot of joy and excitement. It is also a  time of many changes in the life of the parents. No matter how many times a mother gives birth, each child brings new challenges to the family, including different ways of relating to one another. It is common to have feelings of excitement along with confusing changes in moods, emotions, and thoughts. You may feel happy one minute and sad or stressed the next. These feelings of sadness usually happen in the period right after you have your baby, and they go away within a week or two. This is called the "baby blues." What are the causes? There is no known cause of baby blues. It is likely caused by a combination of factors. However, changes in hormone levels after childbirth are believed to trigger some of the symptoms. Other factors that can play a role in these mood changes include:  Lack of sleep.  Stressful life events, such as poverty, caring for a loved one, or death of a loved one.  Genetics. What are the signs or symptoms? Symptoms of this condition include:  Brief changes in mood, such as going from extreme happiness to sadness.  Decreased concentration.  Difficulty sleeping.  Crying spells and tearfulness.  Loss of appetite.  Irritability.  Anxiety. If the symptoms of baby blues last for more than 2 weeks or become more severe, you may have postpartum depression. How is this diagnosed? This condition is diagnosed based on an evaluation of your symptoms. There are no medical or lab tests that lead to a diagnosis, but there are various questionnaires that a health care provider may use to identify women with the baby blues or postpartum depression. How is this treated? Treatment is not needed for this condition. The baby blues usually go away on their own in 1-2 weeks. Social support is often all that is needed. You will be encouraged to get adequate sleep and rest. Follow these instructions at home: Lifestyle      Get as much rest as you can. Take a nap when the baby  sleeps.  Exercise regularly as told by your health care provider. Some women find yoga and walking to be helpful.  Eat a balanced and nourishing diet. This includes plenty of fruits and vegetables, whole grains, and lean proteins.  Do little things that you enjoy. Have a cup of tea, take a bubble bath, read your favorite magazine, or listen to your favorite music.  Avoid alcohol.  Ask for help with household chores, cooking, grocery shopping, or running errands. Do not try to   do everything yourself. Consider hiring a postpartum doula to help. This is a professional who specializes in providing support to new mothers.  Try not to make any major life changes during pregnancy or right after giving birth. This can add stress. General instructions  Talk to people close to you about how you are feeling. Get support from your partner, family members, friends, or other new moms. You may want to join a support group.  Find ways to cope with stress. This may include: ? Writing your thoughts and feelings in a journal. ? Spending time outside. ? Spending time with people who make you laugh.  Try to stay positive in how you think. Think about the things you are grateful for.  Take over-the-counter and prescription medicines only as told by your health care provider.  Let your health care provider know if you have any concerns.  Keep all postpartum visits as told by your health care provider. This is important. Contact a health care provider if:  Your baby blues do not go away after 2 weeks. Get help right away if:  You have thoughts of taking your own life (suicidal thoughts).  You think you may harm the baby or other people.  You see or hear things that are not there (hallucinations). Summary  After giving birth, you may feel happy one minute and sad or stressed the next. Feelings of sadness that happen right after the baby is born and go away after a week or two are called the "baby  blues."  You can manage the baby blues by getting enough rest, eating a healthy diet, exercising, spending time with supportive people, and finding ways to cope with stress.  If feelings of sadness and stress last longer than 2 weeks or get in the way of caring for your baby, talk to your health care provider. This may mean you have postpartum depression. This information is not intended to replace advice given to you by your health care provider. Make sure you discuss any questions you have with your health care provider. Document Revised: 12/13/2018 Document Reviewed: 10/17/2016 Elsevier Patient Education  2020 Elsevier Inc.  

## 2020-08-06 NOTE — Lactation Note (Signed)
This note was copied from a baby's chart. Lactation Consultation Note  Patient Name: Carolyn Macias FUXNA'T Date: 08/06/2020 Reason for consult: Follow-up assessment;Difficult latch;Primapara;Nipple pain/trauma  LC to room for f/u visit. Mom and baby may d/c today. Mom has bilateral compression stripes and supplemented overnight due to nipple pain. She pumped during the night. Mom has hydrogel and is aware of use. During this visit, LC assisted with positioning/latch and demonstrated chin pull to deepen latch. Mom admits tug without pinching/pain and is able to teach-back chin pull to improve latch. Ed provided regarding feeding and output expectations over the next few days. Mom is aware of community resources and will f/u with Ped for infant wt check. Patient was provided with the opportunity to ask questions. All concerns were addressed.    Feeding Feeding Type: Breast Fed  LATCH Score Latch: Grasps breast easily, tongue down, lips flanged, rhythmical sucking.  Audible Swallowing: Spontaneous and intermittent  Type of Nipple: Everted at rest and after stimulation  Comfort (Breast/Nipple): Filling, red/small blisters or bruises, mild/mod discomfort  Hold (Positioning): Assistance needed to correctly position infant at breast and maintain latch.  LATCH Score: 8  Interventions Interventions: Breast feeding basics reviewed;Support pillows;Assisted with latch;Position options;Comfort gels;Adjust position    Consult Status Consult Status: Follow-up Date: 08/07/20 Follow-up type: In-patient    Elder Negus 08/06/2020, 9:50 AM

## 2020-08-06 NOTE — Progress Notes (Addendum)
10:46am-  CSW spoke with MOB and FOB at bedside to give update on [facility] records. Both reported that they understood drug screen policy and reported no other concerns.   CSW spoke with Pediatrician  on 08/05/20 to confirm that MOB had limited PNC. Per Pediatrician, pediatrician 2 of MOB's visits will not count as PNC visits as they were only ultra sounds. CSW will follow up with Mob at bedside to explain this to MOB and Fob.   Claude Manges Nehal Witting, MSW, LCSW [facility] [phone]

## 2020-09-15 ENCOUNTER — Ambulatory Visit: Payer: Medicaid Other | Admitting: Women's Health

## 2020-11-02 ENCOUNTER — Encounter (HOSPITAL_BASED_OUTPATIENT_CLINIC_OR_DEPARTMENT_OTHER): Payer: Self-pay | Admitting: Orthopedic Surgery

## 2020-11-02 ENCOUNTER — Other Ambulatory Visit: Payer: Self-pay

## 2020-11-02 ENCOUNTER — Other Ambulatory Visit: Payer: Self-pay | Admitting: Orthopedic Surgery

## 2020-11-05 ENCOUNTER — Other Ambulatory Visit (HOSPITAL_COMMUNITY): Payer: Medicaid Other

## 2020-11-05 NOTE — H&P (Signed)
Primary Care Provider: Not noted Referring Provider: None Worker's Comp: No Date of Injury or Onset: 10-19-20  History: CC / Reason for Visit: Right small finger injury HPI: This patient is a 20 year old LHD female who presents for evaluation of a right small finger injury that occurred on the date above.  She is 3 months postpartum, and indicates that she was out drinking with a friend, and woke up with it injured, prompting evaluation.  She presents having been in an ulnar gutter splint since then.  Past medical history, past surgical history, family history, social history, medications, allergies and review of systems are thoroughly reviewed by me, signed and scanned into SRS today.    Exam:  Vitals: Refer to EMR. Constitutional:  WD, WN, NAD HEENT:  NCAT, EOMI Neuro/Psych:  Alert & oriented to person, place, and time; appropriate mood & affect Lymphatic: No generalized UE edema or lymphadenopathy Extremities / MSK:  Both UE are normal with respect to appearance, ranges of motion, joint stability, muscle strength/tone, sensation, & perfusion except as otherwise noted:  The right small finger is grossly deformed, angulated dorsally and ulnarly.  It is bruised and swollen, limiting flexion, and with flexion there is separation from the ring finger  Labs / Xrays:  3 views of the right small finger ordered and obtained today reveal a fracture through the proximal diaphysis of P1, with resultant dorsal and ulnar angulation  Assessment: Angulated displaced right small finger P1 fracture  Plan:  I discussed these findings with her and outlined what she could expect with just continued closed treatment versus operative treatment to correct the skeletal alignment, restore stability, and allow for aggressive rehab, especially since she has been held still for so long at present.  After careful consideration deliberation, she indicated she'd like to proceed operatively, and we will work to get  this scheduled either for Friday or Monday.  The details of the operative procedure were discussed with the patient.  Questions were invited and answered.  In addition to the goal of the procedure, the risks of the procedure to include but not limited to bleeding; infection; damage to the nerves or blood vessels that could result in bleeding, numbness, weakness, chronic pain, and the need for additional procedures; stiffness; the need for revision surgery; and anesthetic risks were reviewed.  No specific outcome was guaranteed or implied.  Informed consent was obtained.  In the interim, she can continue to take the splint on and off and work on motion.  Autoauthenticated,  Cliffton Asters. Janee Morn, MD  CC: None

## 2020-11-05 NOTE — Progress Notes (Signed)
Left message for patient to go for covid test. Left address and times.

## 2020-11-08 ENCOUNTER — Encounter (HOSPITAL_BASED_OUTPATIENT_CLINIC_OR_DEPARTMENT_OTHER): Payer: Self-pay | Admitting: Orthopedic Surgery

## 2020-11-08 ENCOUNTER — Encounter (HOSPITAL_BASED_OUTPATIENT_CLINIC_OR_DEPARTMENT_OTHER)
Admission: RE | Disposition: A | Discharge status: Home / Self Care | Payer: Self-pay | Source: Home / Self Care | Attending: Orthopedic Surgery

## 2020-11-08 ENCOUNTER — Ambulatory Visit (HOSPITAL_BASED_OUTPATIENT_CLINIC_OR_DEPARTMENT_OTHER)
Admission: RE | Admit: 2020-11-08 | Discharge: 2020-11-08 | Disposition: A | Discharge status: Home / Self Care | Payer: Medicaid Other | Attending: Orthopedic Surgery | Admitting: Orthopedic Surgery

## 2020-11-08 ENCOUNTER — Ambulatory Visit (HOSPITAL_COMMUNITY): Payer: Medicaid Other

## 2020-11-08 ENCOUNTER — Other Ambulatory Visit: Payer: Self-pay

## 2020-11-08 ENCOUNTER — Ambulatory Visit (HOSPITAL_BASED_OUTPATIENT_CLINIC_OR_DEPARTMENT_OTHER): Payer: Medicaid Other | Admitting: Anesthesiology

## 2020-11-08 DIAGNOSIS — S62616B Displaced fracture of proximal phalanx of right little finger, initial encounter for open fracture: Secondary | ICD-10-CM | POA: Diagnosis not present

## 2020-11-08 DIAGNOSIS — Z20822 Contact with and (suspected) exposure to covid-19: Secondary | ICD-10-CM | POA: Insufficient documentation

## 2020-11-08 DIAGNOSIS — X58XXXA Exposure to other specified factors, initial encounter: Secondary | ICD-10-CM | POA: Insufficient documentation

## 2020-11-08 DIAGNOSIS — Z419 Encounter for procedure for purposes other than remedying health state, unspecified: Secondary | ICD-10-CM

## 2020-11-08 HISTORY — PX: OPEN REDUCTION INTERNAL FIXATION (ORIF) PROXIMAL PHALANX: SHX6235

## 2020-11-08 HISTORY — DX: Depression, unspecified: F32.A

## 2020-11-08 LAB — POCT PREGNANCY, URINE: Preg Test, Ur: NEGATIVE

## 2020-11-08 LAB — SARS CORONAVIRUS 2 BY RT PCR (HOSPITAL ORDER, PERFORMED IN ~~LOC~~ HOSPITAL LAB): SARS Coronavirus 2: NEGATIVE

## 2020-11-08 SURGERY — OPEN REDUCTION INTERNAL FIXATION (ORIF) PROXIMAL PHALANX
Anesthesia: Monitor Anesthesia Care | Site: Finger | Laterality: Right

## 2020-11-08 MED ORDER — ONDANSETRON HCL 4 MG/2ML IJ SOLN
INTRAMUSCULAR | Status: AC
  Filled 2020-11-08: qty 2

## 2020-11-08 MED ORDER — FENTANYL CITRATE (PF) 100 MCG/2ML IJ SOLN
INTRAMUSCULAR | Status: AC
  Filled 2020-11-08: qty 2

## 2020-11-08 MED ORDER — FENTANYL CITRATE (PF) 100 MCG/2ML IJ SOLN
100.0000 ug | Freq: Once | INTRAMUSCULAR | Status: AC
  Administered 2020-11-08: 100 ug via INTRAVENOUS

## 2020-11-08 MED ORDER — FENTANYL CITRATE (PF) 100 MCG/2ML IJ SOLN: 25.0000 ug | INTRAMUSCULAR | Status: DC | PRN

## 2020-11-08 MED ORDER — ROPIVACAINE HCL 5 MG/ML IJ SOLN
INTRAMUSCULAR | Status: DC | PRN
  Administered 2020-11-08: 30 mL via PERINEURAL

## 2020-11-08 MED ORDER — MIDAZOLAM HCL 2 MG/2ML IJ SOLN
INTRAMUSCULAR | Status: AC
  Filled 2020-11-08: qty 2

## 2020-11-08 MED ORDER — LACTATED RINGERS IV SOLN: INTRAVENOUS | Status: DC

## 2020-11-08 MED ORDER — OXYCODONE HCL 5 MG PO TABS: 5.0000 mg | ORAL_TABLET | Freq: Four times a day (QID) | ORAL | 0 refills | Status: AC | PRN

## 2020-11-08 MED ORDER — IBUPROFEN 200 MG PO TABS: 600.0000 mg | ORAL_TABLET | Freq: Four times a day (QID) | ORAL | Status: AC

## 2020-11-08 MED ORDER — AMISULPRIDE (ANTIEMETIC) 5 MG/2ML IV SOLN: 10.0000 mg | Freq: Once | INTRAVENOUS | Status: DC | PRN

## 2020-11-08 MED ORDER — ACETAMINOPHEN 325 MG PO TABS: 650.0000 mg | ORAL_TABLET | Freq: Four times a day (QID) | ORAL | Status: AC

## 2020-11-08 MED ORDER — CEFAZOLIN SODIUM-DEXTROSE 2-4 GM/100ML-% IV SOLN
2.0000 g | INTRAVENOUS | Status: AC
  Administered 2020-11-08: 2 g via INTRAVENOUS

## 2020-11-08 MED ORDER — CEFAZOLIN SODIUM-DEXTROSE 2-4 GM/100ML-% IV SOLN
INTRAVENOUS | Status: AC
  Filled 2020-11-08: qty 100

## 2020-11-08 MED ORDER — 0.9 % SODIUM CHLORIDE (POUR BTL) OPTIME
TOPICAL | Status: DC | PRN
  Administered 2020-11-08: 200 mL

## 2020-11-08 MED ORDER — OXYCODONE HCL 5 MG PO TABS: 5.0000 mg | ORAL_TABLET | Freq: Once | ORAL | Status: DC | PRN

## 2020-11-08 MED ORDER — OXYCODONE HCL 5 MG/5ML PO SOLN
5.0000 mg | Freq: Once | ORAL | Status: DC | PRN
Start: 2020-11-08 — End: 2020-11-08

## 2020-11-08 MED ORDER — PROMETHAZINE HCL 25 MG/ML IJ SOLN: 6.2500 mg | INTRAMUSCULAR | Status: DC | PRN

## 2020-11-08 MED ORDER — MIDAZOLAM HCL 2 MG/2ML IJ SOLN
2.0000 mg | Freq: Once | INTRAMUSCULAR | Status: AC
  Administered 2020-11-08: 2 mg via INTRAVENOUS

## 2020-11-08 MED ORDER — PROPOFOL 500 MG/50ML IV EMUL
INTRAVENOUS | Status: DC | PRN
  Administered 2020-11-08: 125 ug/kg/min via INTRAVENOUS

## 2020-11-08 MED ORDER — ONDANSETRON HCL 4 MG/2ML IJ SOLN
INTRAMUSCULAR | Status: DC | PRN
  Administered 2020-11-08: 4 mg via INTRAVENOUS

## 2020-11-08 MED ORDER — LIDOCAINE 2% (20 MG/ML) 5 ML SYRINGE
INTRAMUSCULAR | Status: DC | PRN
  Administered 2020-11-08: 60 mg via INTRAVENOUS

## 2020-11-08 SURGICAL SUPPLY — 62 items
BAND RUBBER #18 3X1/16 STRL (MISCELLANEOUS) IMPLANT
BIT DRILL 1.1X60MM (BIT) ×1 IMPLANT
BLADE MINI RND TIP GREEN BEAV (BLADE) IMPLANT
BLADE SURG 15 STRL LF DISP TIS (BLADE) ×2 IMPLANT
BLADE SURG 15 STRL SS (BLADE) ×2
BNDG COHESIVE 2X5 TAN STRL LF (GAUZE/BANDAGES/DRESSINGS) IMPLANT
BNDG COHESIVE 4X5 TAN STRL (GAUZE/BANDAGES/DRESSINGS) ×2 IMPLANT
BNDG ESMARK 4X9 LF (GAUZE/BANDAGES/DRESSINGS) ×2 IMPLANT
BNDG GAUZE ELAST 4 BULKY (GAUZE/BANDAGES/DRESSINGS) ×2 IMPLANT
CAP PIN PROTECTOR ORTHO WHT (CAP) IMPLANT
CHLORAPREP W/TINT 26 (MISCELLANEOUS) ×2 IMPLANT
CORD BIPOLAR FORCEPS 12FT (ELECTRODE) ×2 IMPLANT
COVER BACK TABLE 60X90IN (DRAPES) ×2 IMPLANT
COVER MAYO STAND STRL (DRAPES) ×2 IMPLANT
COVER SURGICAL LIGHT HANDLE (MISCELLANEOUS) ×2 IMPLANT
COVER WAND RF STERILE (DRAPES) IMPLANT
CUFF TOURN SGL QUICK 18X4 (TOURNIQUET CUFF) ×2 IMPLANT
DRAPE C-ARM 42X72 X-RAY (DRAPES) ×2 IMPLANT
DRAPE EXTREMITY T 121X128X90 (DISPOSABLE) ×2 IMPLANT
DRAPE SURG 17X23 STRL (DRAPES) ×2 IMPLANT
DRILL 1.1X60MM (BIT) ×2
DRIVER BIT 1.5 (TRAUMA) ×2 IMPLANT
DRSG EMULSION OIL 3X3 NADH (GAUZE/BANDAGES/DRESSINGS) ×2 IMPLANT
GAUZE SPONGE 4X4 12PLY STRL LF (GAUZE/BANDAGES/DRESSINGS) ×2 IMPLANT
GLOVE SRG 8 PF TXTR STRL LF DI (GLOVE) ×1 IMPLANT
GLOVE SURG ENC MOIS LTX SZ7.5 (GLOVE) ×2 IMPLANT
GLOVE SURG LTX SZ6.5 (GLOVE) ×2 IMPLANT
GLOVE SURG UNDER POLY LF SZ7 (GLOVE) ×2 IMPLANT
GLOVE SURG UNDER POLY LF SZ8 (GLOVE) ×1
GOWN STRL REUS W/ TWL LRG LVL3 (GOWN DISPOSABLE) ×1 IMPLANT
GOWN STRL REUS W/TWL LRG LVL3 (GOWN DISPOSABLE) ×1
GOWN STRL REUS W/TWL XL LVL3 (GOWN DISPOSABLE) ×2 IMPLANT
K-WIRE .045X4 (WIRE) IMPLANT
K-WIRE 9  SMOOTH .045 (WIRE) IMPLANT
K-WIRE DBL TRONS .035X6 (WIRE) ×2
KWIRE DBL TRONS .035X6 (WIRE) ×1 IMPLANT
LOCK SCREW 1.5X9MM (Screw) ×4 IMPLANT
NEEDLE HYPO 25X1 1.5 SAFETY (NEEDLE) IMPLANT
NS IRRIG 1000ML POUR BTL (IV SOLUTION) ×2 IMPLANT
PACK BASIN DAY SURGERY FS (CUSTOM PROCEDURE TRAY) ×2 IMPLANT
PADDING CAST ABS 4INX4YD NS (CAST SUPPLIES) ×1
PADDING CAST ABS COTTON 4X4 ST (CAST SUPPLIES) ×1 IMPLANT
PLATE T SMALL 1.5MM (Plate) ×2 IMPLANT
SCREW L 1.5X12 (Screw) ×4 IMPLANT
SCREW LOCK 1.5X9MM (Screw) ×2 IMPLANT
SCREW LOCKING 1.5X13MM (Screw) ×2 IMPLANT
SCREW LOCKING 1.5X8 (Screw) ×4 IMPLANT
SLING ARM FOAM STRAP LRG (SOFTGOODS) ×2 IMPLANT
SPLINT PLASTER CAST XFAST 3X15 (CAST SUPPLIES) ×5 IMPLANT
SPLINT PLASTER XTRA FASTSET 3X (CAST SUPPLIES) ×5
STOCKINETTE 6  STRL (DRAPES) ×1
STOCKINETTE 6 STRL (DRAPES) ×1 IMPLANT
SUCTION FRAZIER HANDLE 10FR (MISCELLANEOUS)
SUCTION TUBE FRAZIER 10FR DISP (MISCELLANEOUS) IMPLANT
SUT VICRYL 4-0 PS2 18IN ABS (SUTURE) ×2 IMPLANT
SUT VICRYL RAPIDE 4-0 (SUTURE) IMPLANT
SUT VICRYL RAPIDE 4/0 PS 2 (SUTURE) ×2 IMPLANT
SYR 10ML LL (SYRINGE) IMPLANT
SYR BULB EAR ULCER 3OZ GRN STR (SYRINGE) ×2 IMPLANT
TOWEL GREEN STERILE FF (TOWEL DISPOSABLE) ×2 IMPLANT
TUBE CONNECTING 20X1/4 (TUBING) IMPLANT
UNDERPAD 30X36 HEAVY ABSORB (UNDERPADS AND DIAPERS) ×2 IMPLANT

## 2020-11-08 NOTE — Discharge Instructions (Signed)
Discharge Instructions   You have a dressing with a plaster splint incorporated in it. Move your fingers as much as possible, making a full fist and fully opening the fist. Elevate your hand to reduce pain & swelling of the digits.  Ice over the operative site may be helpful to reduce pain & swelling.  DO NOT USE HEAT. Pain medicine has been prescribed for you.   Leave the dressing in place until you go to therapy. You may shower, but keep the bandage clean & dry.  You may drive a car when you are off of prescription pain medications and can safely control your vehicle with both hands. Our office will call you to arrange follow-up.  There will be 2 appointments, one with therapy to have a splint made and instruct you in rehab, and one with [place] in the office.   Please call [phone] during normal business hours or [phone] after hours for any problems. Including the following:  - excessive redness of the incisions - drainage for more than 4 days - fever of more than 101.5 F  *Please note that pain medications will not be refilled after hours or on weekends.   Regional Anesthesia Blocks  1. Numbness or the inability to move the "blocked" extremity may last from 3-48 hours after placement. The length of time depends on the medication injected and your individual response to the medication. If the numbness is not going away after 48 hours, call your surgeon.  2. The extremity that is blocked will need to be protected until the numbness is gone and the  Strength has returned. Because you cannot feel it, you will need to take extra care to avoid injury. Because it may be weak, you may have difficulty moving it or using it. You may not know what position it is in without looking at it while the block is in effect.  3. For blocks in the legs and feet, returning to weight bearing and walking needs to be done carefully. You will need to wait until the numbness is entirely gone and the  strength has returned. You should be able to move your leg and foot normally before you try and bear weight or walk. You will need someone to be with you when you first try to ensure you do not fall and possibly risk injury.  4. Bruising and tenderness at the needle site are common side effects and will resolve in a few days.  5. Persistent numbness or new problems with movement should be communicated to the surgeon or the [facility] [phone] [facility] [phone]).    Post Anesthesia Home Care Instructions  Activity: Get plenty of rest for the remainder of the day. A responsible individual must stay with you for 24 hours following the procedure.  For the next 24 hours, DO NOT: -Drive a car -Advertising copywriter -Drink alcoholic beverages -Take any medication unless instructed by your physician -Make any legal decisions or sign important papers.  Meals: Start with liquid foods such as gelatin or soup. Progress to regular foods as tolerated. Avoid greasy, spicy, heavy foods. If nausea and/or vomiting occur, drink only clear liquids until the nausea and/or vomiting subsides. Call your physician if vomiting continues.  Special Instructions/Symptoms: Your throat may feel dry or sore from the anesthesia or the breathing tube placed in your throat during surgery. If this causes discomfort, gargle with warm salt water. The discomfort should disappear within 24 hours.  If you  had a scopolamine patch placed behind your ear for the management of post- operative nausea and/or vomiting:  1. The medication in the patch is effective for 72 hours, after which it should be removed.  Wrap patch in a tissue and discard in the trash. Wash hands thoroughly with soap and water. 2. You may remove the patch earlier than 72 hours if you experience unpleasant side effects which may include dry mouth, dizziness or visual disturbances. 3. Avoid touching the patch. Wash your hands  with soap and water after contact with the patch.

## 2020-11-08 NOTE — Transfer of Care (Signed)
Immediate Anesthesia Transfer of Care Note  Patient: Carolyn Macias  Procedure(s) Performed: OPEN TREATMENT OF RIGHT SMALL FINGER PROXIMAL PHALANX FRACTURE (Right Finger)  Patient Location: PACU  Anesthesia Type:MAC and Regional  Level of Consciousness: awake, alert  and oriented  Airway & Oxygen Therapy: Patient Spontanous Breathing and Patient connected to face mask oxygen  Post-op Assessment: Report given to RN and Post -op Vital signs reviewed and stable  Post vital signs: Reviewed and stable  Last Vitals:  Vitals Value Taken Time  BP    Temp    Pulse 72 11/08/20 1521  Resp 19 11/08/20 1521  SpO2 100 % 11/08/20 1521  Vitals shown include unvalidated device data.  Last Pain:  Vitals:   11/08/20 1231  TempSrc: Oral  PainSc: 0-No pain         Complications: No complications documented.

## 2020-11-08 NOTE — Anesthesia Procedure Notes (Signed)
Procedure Name: MAC Date/Time: 11/08/2020 2:22 PM Performed by: Verita Lamb, CRNA Pre-anesthesia Checklist: Patient identified, Emergency Drugs available, Suction available, Patient being monitored and Timeout performed Patient Re-evaluated:Patient Re-evaluated prior to induction Oxygen Delivery Method: Simple face mask Preoxygenation: Pre-oxygenation with 100% oxygen Induction Type: IV induction

## 2020-11-08 NOTE — Interval H&P Note (Signed)
History and Physical Interval Note:  11/08/2020 2:07 PM  Carolyn Macias  has presented today for surgery, with the diagnosis of RIGHT SMALL FINGER FRACTURE.  The various methods of treatment have been discussed with the patient and family. After consideration of risks, benefits and other options for treatment, the patient has consented to  Procedure(s) with comments: OPEN TREATMENT OF RIGHT SMALL FINGER PROXIMAL PHALANX FRACTURE (Right) - LENGTH OF SURGERY: 90 MIN  PR OP BLOCK as a surgical intervention.  The patient's history has been reviewed, patient examined, no change in status, stable for surgery.  I have reviewed the patient's chart and labs.  Questions were answered to the patient's satisfaction.     Jodi Marble

## 2020-11-08 NOTE — Progress Notes (Signed)
Assisted Dr. Bass with right, ultrasound guided, supraclavicular block. Side rails up, monitors on throughout procedure. See vital signs in flow sheet. Tolerated Procedure well. ?

## 2020-11-08 NOTE — Anesthesia Postprocedure Evaluation (Signed)
Anesthesia Post Note  Patient: Carolyn Macias  Procedure(s) Performed: OPEN TREATMENT OF RIGHT SMALL FINGER PROXIMAL PHALANX FRACTURE (Right Finger)     Patient location during evaluation: PACU Anesthesia Type: Regional and MAC Level of consciousness: awake and alert Pain management: pain level controlled Vital Signs Assessment: post-procedure vital signs reviewed and stable Respiratory status: spontaneous breathing and respiratory function stable Cardiovascular status: stable Postop Assessment: no apparent nausea or vomiting Anesthetic complications: no   No complications documented.  Last Vitals:  Vitals:   11/08/20 1515 11/08/20 1530  BP: (!) 106/58 (!) 115/59  Pulse: 73 67  Resp: 16 17  Temp: 36.6 C   SpO2: 100% 100%    Last Pain:  Vitals:   11/08/20 1515  TempSrc:   PainSc: 0-No pain                 [name]

## 2020-11-08 NOTE — Op Note (Signed)
11/08/2020  3:27 PM  PATIENT:  Carolyn Macias  20 y.o. female  PRE-OPERATIVE DIAGNOSIS: Displaced right small finger P1 fracture  POST-OPERATIVE DIAGNOSIS:  Same  PROCEDURE: ORIF right small finger P1 fracture  SURGEON: Rayvon Char. Grandville Silos, MD  PHYSICIAN ASSISTANT: None  ANESTHESIA:  regional and MAC  SPECIMENS:  None  DRAINS:   None  EBL:  less than 50 mL  PREOPERATIVE INDICATIONS:  [name] is a  20 y.o. female with a displaced fairly significantly dorsally angulated right small finger P1 fracture  The risks benefits and alternatives were discussed with the patient preoperatively including but not limited to the risks of infection, bleeding, nerve injury, cardiopulmonary complications, the need for revision surgery, among others, and the patient verbalized understanding and consented to proceed.  OPERATIVE IMPLANTS: Biomet ALPS hand fracture set, 1.5 mm small T plate and locking screws  OPERATIVE PROCEDURE:  After receiving prophylactic antibiotics and a regional block, the patient was escorted to the operative theatre and placed in a supine position.  A surgical "time-out" was performed during which the planned procedure, proposed operative site, and the correct patient identity were compared to the operative consent and agreement confirmed by the circulating nurse according to current facility policy.  Following application of a tourniquet to the operative extremity, the exposed skin was prescrubbed with a Hibiclens scrub brush before being formally prepped with Chloraprep and draped in the usual sterile fashion.  The limb was exsanguinated with an Esmarch bandage and the tourniquet inflated to approximately 139mHg higher than systolic BP.  A dorsal curvilinear approach was made sharply with a scalpel.  Full-thickness flaps were elevated.  The extensor apparatus was split dorsally in the midline and reflected.  The periosteum was split just off the midline and reflected  subperiosteally to expose the fracture.  The periosteal capsular layer was reflected as a hole, exposing the joint.  The cartilage was unremarkable without significant injury.  There was dorsal impaction, which was disimpacted and the fragments were free-floating.  A small T plate was applied to the dorsum of the proximal phalanx, provisionally secured with K wires.  Fluoroscopic alignment was judged to be acceptable as was clinical alignment and the provisional fixation was swapped for definitive fixation with locking screws.  Final images were obtained.  The wound was irrigated and the periosteum reapproximated over the plate, incorporating capsular closure loosely with 4-0 Vicryl Rapide suture.  The extensor apparatus itself was closed with 4-0 Vicryl running suture.  The tourniquet was released some additional hemostasis obtained with bipolar electrocautery.  The skin was then reapproximated a running horizontal mattress suture of 4-0 Vicryl Rapide.  A short arm splint dressing was applied with the MP joints flexed and she was taken to the recovery room in stable condition.  DISPOSITION: She will be discharged today with typical instructions, being contacted by our office to arrange for follow-up with [facility] therapy hopefully within the next week or so to have a custom splint fabricated and initiation of rehab, followed by a follow-up appointment in our office at the 10 to 15-day mark, with new x-rays of the right small finger out of splint

## 2020-11-08 NOTE — Anesthesia Preprocedure Evaluation (Addendum)
Anesthesia Evaluation  Patient identified by MRN, date of birth, ID band Patient awake    Reviewed: Allergy & Precautions, H&P , NPO status , Patient's Chart, lab work & pertinent test results  Airway Mallampati: II  TM Distance: >3 FB Neck ROM: Full    Dental no notable dental hx.    Pulmonary Current Smoker, former smoker,    Pulmonary exam normal breath sounds clear to auscultation       Cardiovascular negative cardio ROS Normal cardiovascular exam Rhythm:Regular Rate:Normal     Neuro/Psych PSYCHIATRIC DISORDERS Depression negative neurological ROS     GI/Hepatic negative GI ROS, Neg liver ROS,   Endo/Other  negative endocrine ROS  Renal/GU negative Renal ROS  negative genitourinary   Musculoskeletal negative musculoskeletal ROS (+)   Abdominal   Peds negative pediatric ROS (+)  Hematology negative hematology ROS (+)   Anesthesia Other Findings   Reproductive/Obstetrics negative OB ROS                            Anesthesia Physical Anesthesia Plan  ASA: II  Anesthesia Plan: MAC and Regional   Post-op Pain Management:  Regional for Post-op pain   Induction: Intravenous  PONV Risk Score and Plan: 2 and Ondansetron, Propofol infusion, TIVA and Treatment may vary due to age or medical condition  Airway Management Planned:   Additional Equipment: None  Intra-op Plan:   Post-operative Plan:   Informed Consent: I have reviewed the patients History and Physical, chart, labs and discussed the procedure including the risks, benefits and alternatives for the proposed anesthesia with the patient or authorized representative who has indicated his/her understanding and acceptance.       Plan Discussed with: CRNA and Anesthesiologist  Anesthesia Plan Comments: (supraclavicular block + propofol gtt. GA/LMA as backup plan. Norton Blizzard, MD  )     Anesthesia Quick Evaluation

## 2020-11-08 NOTE — Anesthesia Procedure Notes (Signed)
Anesthesia Regional Block: Supraclavicular block   Pre-Anesthetic Checklist: ,, timeout performed, Correct Patient, Correct Site, Correct Laterality, Correct Procedure, Correct Position, site marked, Risks and benefits discussed,  Surgical consent,  Pre-op evaluation,  At surgeon's request and post-op pain management  Laterality: Right  Prep: chloraprep       Needles:  Injection technique: Single-shot  Needle Type: Echogenic Stimulator Needle     Needle Length: 10cm  Needle Gauge: 21     Additional Needles:   Procedures:,,,, ultrasound used (permanent image in chart),,,,  Narrative:  Injection made incrementally with aspirations every 5 mL.  Performed by: Personally  Anesthesiologist: Mellody Dance, MD  Additional Notes: Functioning IV was confirmed and monitors applied. Sterile prep and drape,hand hygiene and sterile gloves were used.Ultrasound guidance: relevant anatomy identified, needle position confirmed, local anesthetic spread visualized around nerve(s)., vascular puncture avoided.  Image printed for medical record.  Negative aspiration and negative test dose prior to incremental administration of local anesthetic. The patient tolerated the procedure well.

## 2020-11-10 ENCOUNTER — Encounter (HOSPITAL_BASED_OUTPATIENT_CLINIC_OR_DEPARTMENT_OTHER): Payer: Self-pay | Admitting: Orthopedic Surgery

## 2020-11-16 ENCOUNTER — Ambulatory Visit: Payer: Medicaid Other | Admitting: *Deleted

## 2020-11-18 ENCOUNTER — Other Ambulatory Visit: Payer: Self-pay

## 2020-11-18 ENCOUNTER — Ambulatory Visit: Payer: Medicaid Other | Attending: Orthopedic Surgery | Admitting: Occupational Therapy

## 2020-11-18 DIAGNOSIS — M6281 Muscle weakness (generalized): Secondary | ICD-10-CM | POA: Insufficient documentation

## 2020-11-18 DIAGNOSIS — M25641 Stiffness of right hand, not elsewhere classified: Secondary | ICD-10-CM | POA: Insufficient documentation

## 2020-11-18 DIAGNOSIS — M25541 Pain in joints of right hand: Secondary | ICD-10-CM | POA: Insufficient documentation

## 2020-11-18 DIAGNOSIS — R278 Other lack of coordination: Secondary | ICD-10-CM | POA: Diagnosis present

## 2020-11-18 NOTE — Therapy (Signed)
[facility] [address] [place], [place], [address] Phone: [phone]   Fax:  [phone]  Occupational Therapy Evaluation  Patient Details  Name: Carolyn Macias MRN: [id number] Date of Birth: Apr 14, 2001 Referring Provider (OT): Dr. Janee Morn   Encounter Date: 11/18/2020   OT End of Session - 11/18/20 1046    Visit Number 1    Number of Visits 8    Date for OT Re-Evaluation 01/18/21    Authorization Type [place] [employer] - awaiting auth    OT Start Time 0850    OT Stop Time 1015    OT Time Calculation (min) 85 min    Activity Tolerance Patient tolerated treatment well           Past Medical History:  Diagnosis Date  . Chlamydia   . Depression     Past Surgical History:  Procedure Laterality Date  . NO PAST SURGERIES    . OPEN REDUCTION INTERNAL FIXATION (ORIF) PROXIMAL PHALANX Right 11/08/2020   Procedure: OPEN TREATMENT OF RIGHT SMALL FINGER PROXIMAL PHALANX FRACTURE;  Surgeon: Mack Hook, MD;  Location: [facility];  Service: Orthopedics;  Laterality: Right;    There were no vitals filed for this visit.   Subjective Assessment - 11/18/20 0856    Pertinent History ORIF RT SF P1 Fx 11/08/20. Boxer's fx 2019 Rt hand    Limitations Wait on self PROM and full composite flexion until next week, no strengthening    Currently in Pain? No/denies             [facility] OT Assessment - 11/18/20 0001      Assessment   Medical Diagnosis ORIF Rt SF P1 Fx    Referring Provider (OT) Dr. Janee Morn    Onset Date/Surgical Date 11/08/20    Hand Dominance Right   for all except writing w/ Lt hand   Prior Therapy none      Precautions   Precautions Other (comment)    Precaution Comments no P/ROM or strengthening   ok to begin self PROM and full arc composite flexion in a few days   Required Braces or Orthoses Other Brace/Splint    Other Brace/Splint hand based safe position splint to include SF and  RF      Home  Environment   Additional Comments Pt as 22 month old infant. Lives on 2nd floor apt but has no trouble with balance or stairs    Lives With Significant other      Prior Function   Level of Independence Independent    Vocation Unemployed      ADL   ADL comments Mod I for BADLS, Assist from boyfriend w/ bathing infant and childcare      Written Expression   Dominant Hand Right   except Lt handed for writing   Handwriting --   no changes     Edema   Edema very mild at incision area      Right Hand AROM   R Ring  MCP 0-90 50 Degrees    R Ring PIP 0-100 105 Degrees    R Little  MCP 0-90 35 Degrees    R Little PIP 0-100 50 Degrees   -35 extensor lag                   OT Treatments/Exercises (OP) - 11/18/20 0001      ADLs   ADL Comments Surgical wrappings/dressings carefully removed and cleaned hand prior to splint  fabrication. Pt educated in splint wear and care, hygiene care, and precautions      Exercises   Exercises Hand      Hand Exercises   Other Hand Exercises Pt was issued A/ROM HEP - see pt instructions for details. Pt instructed to do only mid-arc active composite flexion, but will progress to full arc AROM in composite flex and self PROM next week.      Splinting   Splinting Fabricated and fitted hand based safe position splint w/ ring finger included to small finger. Pt was placed in approx 40* MP flexion to achieve full IP extension                 OT Education - 11/18/20 1001    Education Details A/ROM HEP, splint wear and care, precautions            OT Short Term Goals - 11/18/20 1057      OT SHORT TERM GOAL #1   Title Independent with splint wear and care    Baseline issued, may need adjustments    Time 4    Period Weeks    Status On-going      OT SHORT TERM GOAL #2   Title Independent with initial ROM HEP    Baseline issued, will need updates next week    Time 4    Period Weeks    Status On-going      OT SHORT  TERM GOAL #3   Title Pt to demo less than or equal to -20 degree PIP extensor lag Rt small finger    Baseline -35*    Time 4    Period Weeks    Status New             OT Long Term Goals - 11/18/20 1100      OT LONG TERM GOAL #1   Title Independent with updated strengthening HEP    Baseline Dependent d/t current precautions    Time 8    Period Weeks    Status New      OT LONG TERM GOAL #2   Title Grip strength Rt hand to be 35 lbs or greater    Baseline unable to assess d/t current precautions    Time 8    Period Weeks    Status New      OT LONG TERM GOAL #3   Title Pt to return to using Rt hand as dominant hand for all ADLS (Except writing)    Baseline dependent d/t current precautions - pt only using first 3 fingers for light tasks like hooking buttons, tying shoes    Time 8    Period Weeks    Status New      OT LONG TERM GOAL #4   Title Pt to demo full ROM Rt hand for full composite flexion and no more than 10* PIP extensor lag    Baseline dependent for full flexion, -35* extensor lag    Time 8    Period Weeks    Status New                 Plan - 11/18/20 1050    Clinical Impression Statement Pt is a 20 y.o. female who presents to OPOT for evaluation and splinting s/p ORIF on 11/08/20 for Rt small finger proximal phalanx fracture. Pt arrived today fully wrapped and protected from surgery. Once surgical wrappings removed, pt did show PIP extensor lag and decreased ROM, minimal pain only  when molding splint, and limited use/strength of hand due to current precautions. Pt would benefit from O.T. to address these deficits, and increase use of Rt hand for functional tasks    OT Occupational Profile and History Problem Focused Assessment - Including review of records relating to presenting problem    Occupational performance deficits (Please refer to evaluation for details): ADL's;IADL's;Other   childcare   Body Structure / Function / Physical Skills  ADL;Strength;GMC;Pain;Edema;UE functional use;IADL;ROM;Scar mobility;Coordination;FMC    Rehab Potential Good    Clinical Decision Making Several treatment options, min-mod task modification necessary    Comorbidities Affecting Occupational Performance: None    Modification or Assistance to Complete Evaluation  Min-Moderate modification of tasks or assist with assess necessary to complete eval    OT Frequency 1x / week   per pt request (otherwise would recommend 2x/wk   OT Duration 8 weeks    OT Treatment/Interventions Self-care/ADL training;Moist Heat;Fluidtherapy;DME and/or AE instruction;Splinting;Contrast Bath;Compression bandaging;Therapeutic activities;Therapeutic exercise;Ultrasound;Scar mobilization;Passive range of motion;Electrical Stimulation;Manual Therapy;Patient/family education    Plan splint adjustments prn, progress to full arc A/ROM in composite flexion and self PROM    Consulted and Agree with Plan of Care Patient           Patient will benefit from skilled therapeutic intervention in order to improve the following deficits and impairments:   Body Structure / Function / Physical Skills: ADL,Strength,GMC,Pain,Edema,UE functional use,IADL,ROM,Scar mobility,Coordination,FMC       Visit Diagnosis: Stiffness of right hand, not elsewhere classified  Muscle weakness (generalized)  Pain in joint of right hand  Other lack of coordination    Problem List Patient Active Problem List   Diagnosis Date Noted  . Limited prenatal care in third trimester 07/31/2020  . Post term pregnancy over 40 weeks 07/31/2020  . Rubella non-immune status, antepartum 07/06/2020    Managed medicaid CPT codes: 21308- Therapeutic Exercise, 97140 - Manual Therapy, 97530 - Therapeutic Activities, [phone] - Self Care, [phone] - Electrical stimulation (unattended), [phone] - Ultrasound, [id number] - Orthotic Fit, [id number] - Fluidotherapy, [id number] - Contrast bath and [id number] -  [place]   Kelli Churn,  OTR/L 11/18/2020, 11:05 AM  [facility] [address] [place], [place], [address] Phone: [phone]   Fax:  [phone]  Name: Caleesi Kohl MRN: [id number] Date of Birth: 2001-07-19

## 2020-11-18 NOTE — Patient Instructions (Signed)
  Flexor Tendon Gliding (Active Hook Fist)   With fingers and knuckles straight, bend middle and tip joints. Do not bend large knuckles. Repeat _15-25__ times. Do _4-6___ sessions per day.  MP Flexion (Active)   With back of hand on table, bend large knuckles as far as they will go, keeping small joints straight. Repeat _15-25__ times. Do __4-6__ sessions per day.      Finger Flexion / Extension   Bend fingers of left hand toward palm, making a ONLY 1/2  fist. Straighten fingers, opening fist. Repeat sequence _15-25___ times per session. Do _4-6__ sessions per day.   **4. Keep wrist straight, bend and hold big knuckles down, bend fingers 1/2 way, then STRAIGHTEN middle joints as much as possible while keeping big knuckles bent. Repeat 15-25 reps, 4-6 times per day.     SPLINT WEAR AND CARE:   WEARING SCHEDULE:  Wear splint at ALL times except for hygiene care (May remove splint for exercises and then immediately place back on ONLY if directed by the therapist)  PURPOSE:  To prevent movement and for protection until injury can heal  CARE OF SPLINT:  Keep splint away from heat sources including: stove, radiator or furnace, or a car in sunlight. The splint can melt and will no longer fit you properly  Keep away from pets and children  Clean the splint with rubbing alcohol 1-2 times per day.  * During this time, make sure you also clean your hand/arm as instructed by your therapist and/or perform dressing changes as needed. Then dry hand/arm completely before replacing splint. (When cleaning hand/arm, keep it immobilized in same position until splint is replaced)  PRECAUTIONS/POTENTIAL PROBLEMS: *If you notice or experience increased pain, swelling, numbness, or a lingering reddened area from the splint: Contact your therapist immediately by calling [phone]. You must wear the splint for protection, but we will get you scheduled for adjustments as quickly as possible.  (If  only straps or hooks need to be replaced and NO adjustments to the splint need to be made, just call the office ahead and let them know you are coming in)  If you have any medical concerns or signs of infection, please call your doctor immediately

## 2020-11-24 ENCOUNTER — Ambulatory Visit: Payer: Medicaid Other | Admitting: Occupational Therapy

## 2020-12-01 ENCOUNTER — Ambulatory Visit: Payer: Medicaid Other | Admitting: Occupational Therapy

## 2020-12-08 ENCOUNTER — Ambulatory Visit: Payer: Medicaid Other | Attending: Orthopedic Surgery | Admitting: Occupational Therapy

## 2020-12-15 ENCOUNTER — Ambulatory Visit: Payer: Medicaid Other | Admitting: Occupational Therapy

## 2020-12-22 ENCOUNTER — Ambulatory Visit: Payer: Medicaid Other | Admitting: Occupational Therapy

## 2020-12-29 ENCOUNTER — Ambulatory Visit: Payer: Medicaid Other | Admitting: Occupational Therapy

## 2021-04-28 IMAGING — US US MFM OB LIMITED
1 series · 15 of 22 positions shown · non-contrast
Comparison: none

[Series 1: us mfm ob limited · 15 of 22 slices shown]
[im 1/22]
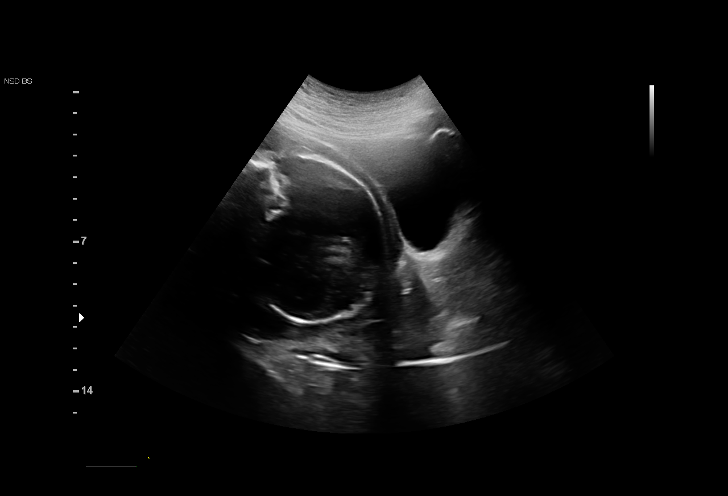
[im 3/22]
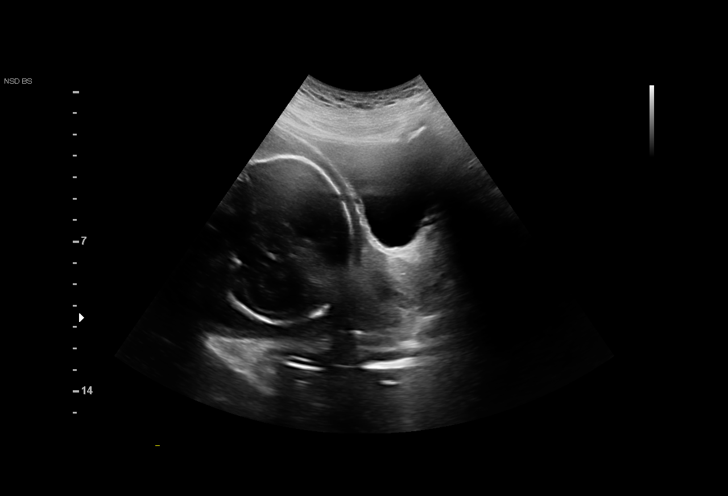
[im 4/22]
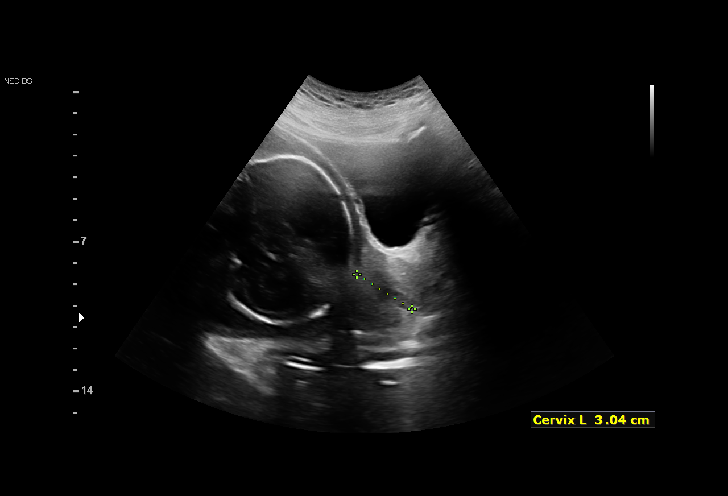
[im 6/22]
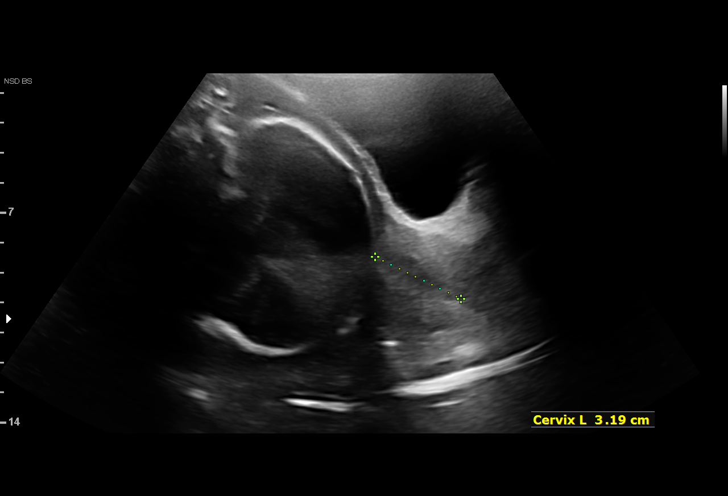
[im 7/22]
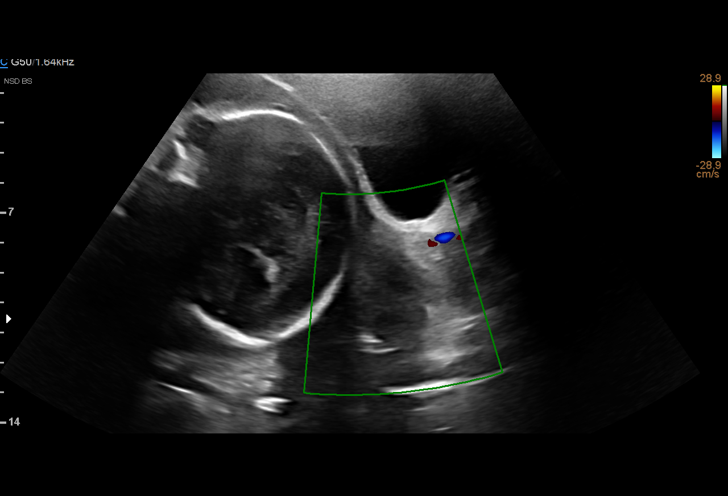
[im 9/22]
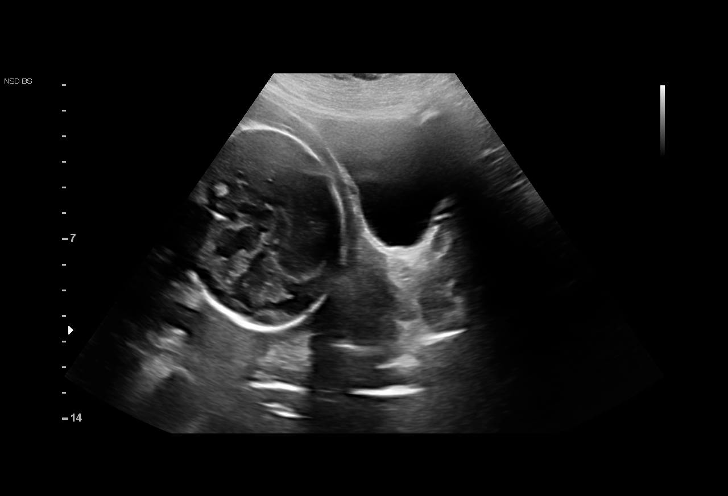
[im 10/22]
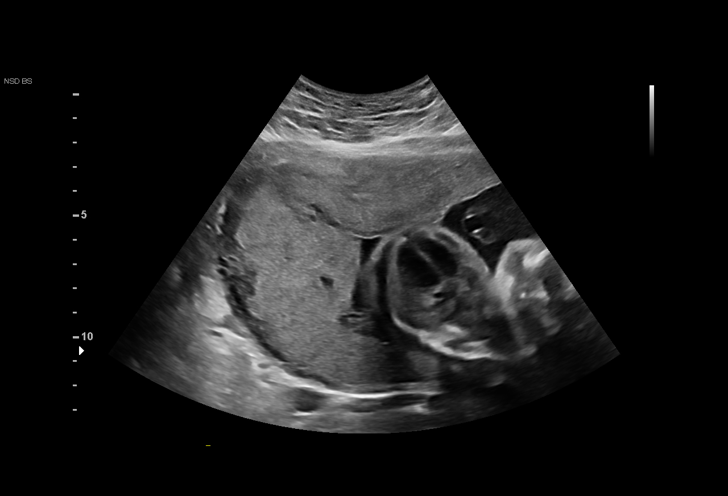
[im 12/22]
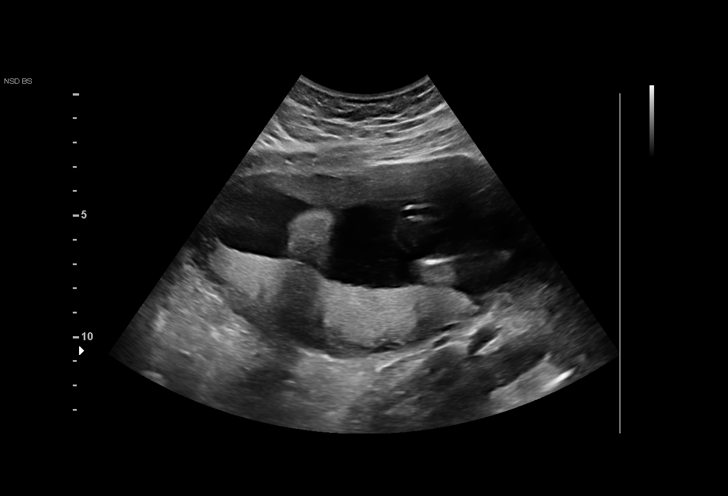
[im 13/22]
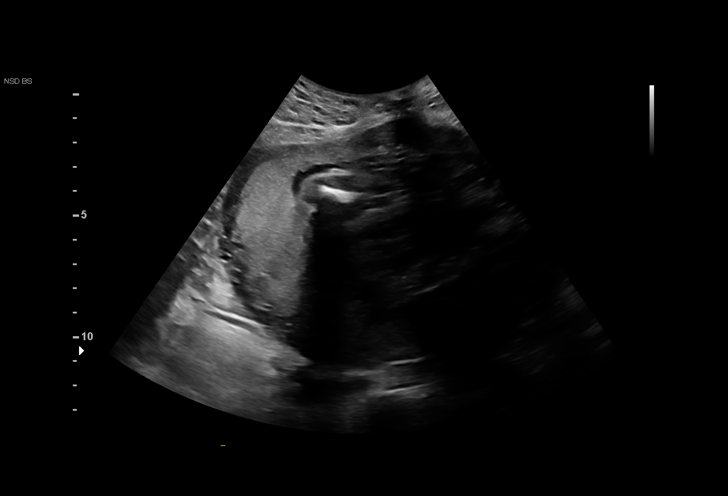
[im 14/22]
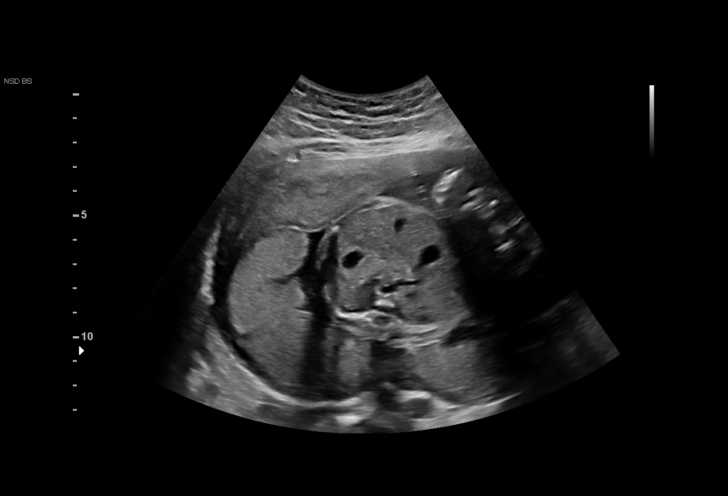
[im 16/22]
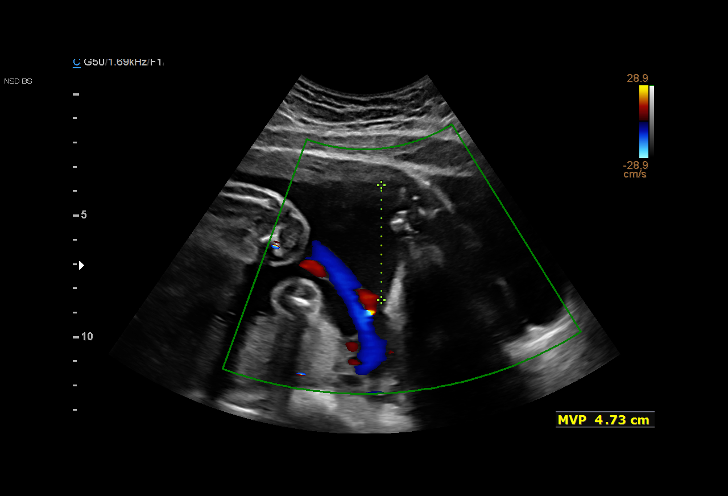
[im 17/22]
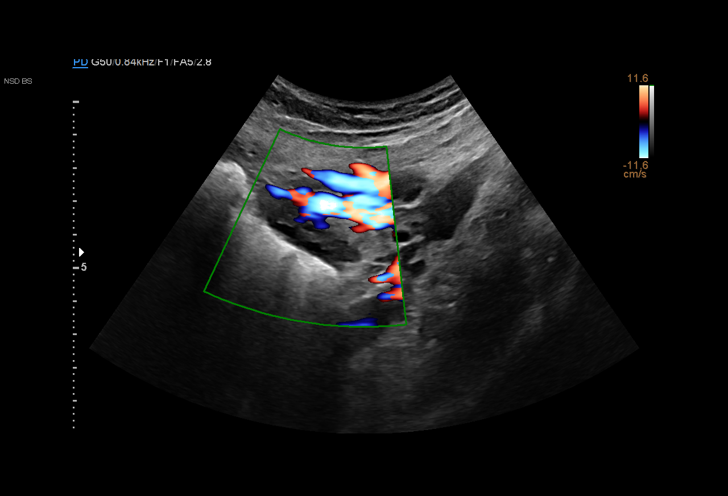
[im 19/22]
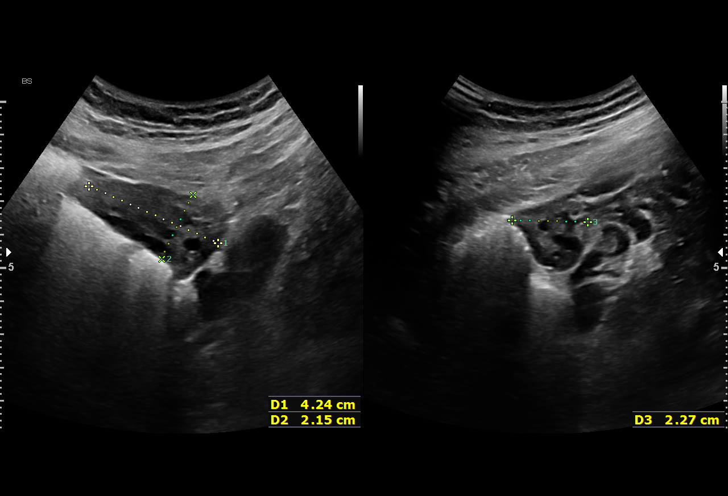
[im 20/22]
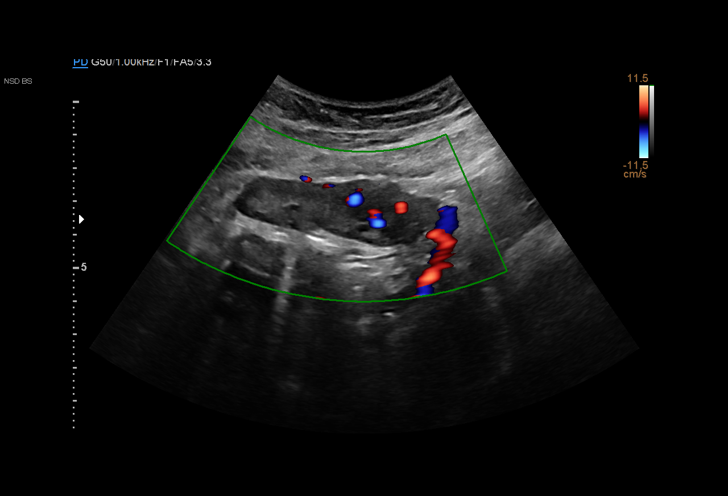
[im 22/22]
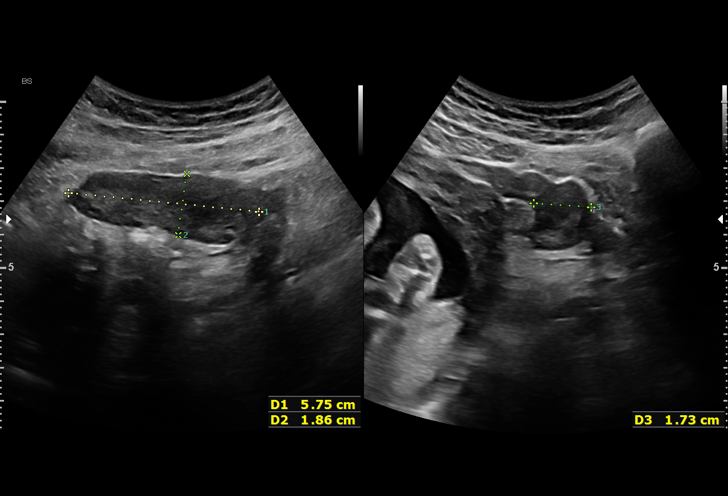

[15 of 22 positions shown; findings below may reference images not displayed]

NATSIR NP                                  [HOSPITAL]

 1  US MFM OB LIMITED                     [id number]    [facility]

Indications

 Pelvic pain affecting pregnancy in second
 trimester (LLQ)
 26 weeks gestation of pregnancy
 Encounter for cervical length
Fetal Evaluation

 Num Of Fetuses:          1
 Fetal Heart Rate(bpm):   147
 Cardiac Activity:        Observed
 Presentation:            Cephalic
 Placenta:                Posterior
 P. Cord Insertion:       Not well visualized

 Amniotic Fluid
 AFI FV:      Within normal limits

                             Largest Pocket(cm)

OB History

 Gravidity:    1         Term:   0        Prem:   0        SAB:   0
 TOP:          0       Ectopic:  0        Living: 0
Gestational Age

 LMP:           26w 4d        Date:  10/22/19                 EDD:   07/28/20
 Best:          26w 4d     Det. By:  LMP  (10/22/19)          EDD:   07/28/20
Cervix Uterus Adnexa
 Cervix
 Length:           3.11  cm.
 Normal appearance by transabdominal scan.

 Right Ovary
 Within normal limits.

 Left Ovary
 Within normal limits.

 Adnexa
 No abnormality visualized.
Impression

 Limited exam for pelvic pain
 Normal amniotic fluid and cervical length
Recommendations

 Follow up per provider.
